# Patient Record
Sex: Male | Born: 1960 | Race: Black or African American | Hispanic: No | Marital: Married | Smoking: Never smoker
Health system: Southern US, Community
[De-identification: ages and names within clinical notes are randomized; demographics above are authoritative.]

## PROBLEM LIST (undated history)

## (undated) DIAGNOSIS — M1990 Unspecified osteoarthritis, unspecified site: Secondary | ICD-10-CM

## (undated) DIAGNOSIS — R7303 Prediabetes: Secondary | ICD-10-CM

## (undated) DIAGNOSIS — T4145XA Adverse effect of unspecified anesthetic, initial encounter: Secondary | ICD-10-CM

## (undated) DIAGNOSIS — I10 Essential (primary) hypertension: Secondary | ICD-10-CM

## (undated) DIAGNOSIS — G709 Myoneural disorder, unspecified: Secondary | ICD-10-CM

## (undated) DIAGNOSIS — T8859XA Other complications of anesthesia, initial encounter: Secondary | ICD-10-CM

## (undated) HISTORY — PX: TOTAL KNEE ARTHROPLASTY: SHX125

## (undated) HISTORY — PX: FOOT FRACTURE SURGERY: SHX645

## (undated) HISTORY — PX: ROTATOR CUFF REPAIR: SHX139

---

## 1999-02-01 ENCOUNTER — Encounter: Payer: Self-pay | Admitting: Emergency Medicine

## 1999-02-01 ENCOUNTER — Emergency Department (HOSPITAL_COMMUNITY): Admission: EM | Admit: 1999-02-01 | Discharge: 1999-02-01 | Payer: Self-pay | Admitting: Emergency Medicine

## 1999-03-13 ENCOUNTER — Emergency Department (HOSPITAL_COMMUNITY): Admission: EM | Admit: 1999-03-13 | Discharge: 1999-03-13 | Payer: Self-pay | Admitting: Emergency Medicine

## 1999-03-13 ENCOUNTER — Encounter: Payer: Self-pay | Admitting: Emergency Medicine

## 1999-06-03 ENCOUNTER — Emergency Department (HOSPITAL_COMMUNITY): Admission: EM | Admit: 1999-06-03 | Discharge: 1999-06-03 | Payer: Self-pay | Admitting: Internal Medicine

## 1999-08-06 ENCOUNTER — Emergency Department (HOSPITAL_COMMUNITY): Admission: EM | Admit: 1999-08-06 | Discharge: 1999-08-06 | Payer: Self-pay | Admitting: Emergency Medicine

## 1999-08-06 ENCOUNTER — Encounter: Payer: Self-pay | Admitting: Emergency Medicine

## 1999-10-16 ENCOUNTER — Emergency Department (HOSPITAL_COMMUNITY): Admission: EM | Admit: 1999-10-16 | Discharge: 1999-10-16 | Payer: Self-pay | Admitting: Emergency Medicine

## 1999-10-16 ENCOUNTER — Encounter: Payer: Self-pay | Admitting: Emergency Medicine

## 2000-05-18 ENCOUNTER — Encounter: Payer: Self-pay | Admitting: Orthopedic Surgery

## 2000-05-18 ENCOUNTER — Ambulatory Visit (HOSPITAL_COMMUNITY): Admission: RE | Admit: 2000-05-18 | Discharge: 2000-05-18 | Payer: Self-pay | Admitting: Orthopedic Surgery

## 2000-09-06 ENCOUNTER — Emergency Department (HOSPITAL_COMMUNITY): Admission: EM | Admit: 2000-09-06 | Discharge: 2000-09-06 | Payer: Self-pay

## 2000-09-07 ENCOUNTER — Emergency Department (HOSPITAL_COMMUNITY): Admission: EM | Admit: 2000-09-07 | Discharge: 2000-09-07 | Payer: Self-pay | Admitting: *Deleted

## 2001-09-19 ENCOUNTER — Emergency Department (HOSPITAL_COMMUNITY): Admission: EM | Admit: 2001-09-19 | Discharge: 2001-09-19 | Payer: Self-pay | Admitting: Emergency Medicine

## 2001-11-08 ENCOUNTER — Ambulatory Visit (HOSPITAL_BASED_OUTPATIENT_CLINIC_OR_DEPARTMENT_OTHER): Admission: RE | Admit: 2001-11-08 | Discharge: 2001-11-08 | Payer: Self-pay | Admitting: Orthopedic Surgery

## 2002-12-29 ENCOUNTER — Encounter: Payer: Self-pay | Admitting: Emergency Medicine

## 2002-12-29 ENCOUNTER — Inpatient Hospital Stay (HOSPITAL_COMMUNITY): Admission: AD | Admit: 2002-12-29 | Discharge: 2002-12-30 | Payer: Self-pay | Admitting: Emergency Medicine

## 2002-12-30 ENCOUNTER — Encounter: Payer: Self-pay | Admitting: Internal Medicine

## 2003-01-04 ENCOUNTER — Encounter: Admission: RE | Admit: 2003-01-04 | Discharge: 2003-01-04 | Payer: Self-pay | Admitting: Internal Medicine

## 2003-01-25 ENCOUNTER — Encounter: Admission: RE | Admit: 2003-01-25 | Discharge: 2003-01-25 | Payer: Self-pay | Admitting: Family Medicine

## 2003-02-02 ENCOUNTER — Encounter: Admission: RE | Admit: 2003-02-02 | Discharge: 2003-02-02 | Payer: Self-pay | Admitting: Family Medicine

## 2003-02-22 ENCOUNTER — Encounter: Admission: RE | Admit: 2003-02-22 | Discharge: 2003-02-22 | Payer: Self-pay | Admitting: Family Medicine

## 2003-03-14 ENCOUNTER — Encounter: Admission: RE | Admit: 2003-03-14 | Discharge: 2003-03-14 | Payer: Self-pay | Admitting: Family Medicine

## 2003-03-22 ENCOUNTER — Encounter: Admission: RE | Admit: 2003-03-22 | Discharge: 2003-03-22 | Payer: Self-pay | Admitting: Sports Medicine

## 2003-07-23 ENCOUNTER — Emergency Department (HOSPITAL_COMMUNITY): Admission: EM | Admit: 2003-07-23 | Discharge: 2003-07-23 | Payer: Self-pay | Admitting: Emergency Medicine

## 2003-10-29 ENCOUNTER — Emergency Department (HOSPITAL_COMMUNITY): Admission: EM | Admit: 2003-10-29 | Discharge: 2003-10-29 | Payer: Self-pay | Admitting: Emergency Medicine

## 2003-11-22 ENCOUNTER — Inpatient Hospital Stay (HOSPITAL_COMMUNITY): Admission: RE | Admit: 2003-11-22 | Discharge: 2003-11-26 | Payer: Self-pay | Admitting: Orthopedic Surgery

## 2004-09-13 ENCOUNTER — Emergency Department (HOSPITAL_COMMUNITY): Admission: EM | Admit: 2004-09-13 | Discharge: 2004-09-13 | Payer: Self-pay | Admitting: Emergency Medicine

## 2004-10-26 ENCOUNTER — Emergency Department (HOSPITAL_COMMUNITY): Admission: EM | Admit: 2004-10-26 | Discharge: 2004-10-26 | Payer: Self-pay | Admitting: Emergency Medicine

## 2004-12-02 ENCOUNTER — Emergency Department (HOSPITAL_COMMUNITY): Admission: EM | Admit: 2004-12-02 | Discharge: 2004-12-02 | Payer: Self-pay | Admitting: Emergency Medicine

## 2006-02-06 ENCOUNTER — Inpatient Hospital Stay (HOSPITAL_COMMUNITY): Admission: RE | Admit: 2006-02-06 | Discharge: 2006-02-10 | Payer: Self-pay | Admitting: Orthopedic Surgery

## 2006-05-11 ENCOUNTER — Ambulatory Visit: Payer: Self-pay | Admitting: Pain Medicine

## 2006-05-28 DIAGNOSIS — R519 Headache, unspecified: Secondary | ICD-10-CM | POA: Insufficient documentation

## 2006-05-28 DIAGNOSIS — R51 Headache: Secondary | ICD-10-CM

## 2006-05-28 DIAGNOSIS — G43909 Migraine, unspecified, not intractable, without status migrainosus: Secondary | ICD-10-CM | POA: Insufficient documentation

## 2006-06-15 ENCOUNTER — Ambulatory Visit: Payer: Self-pay | Admitting: Pain Medicine

## 2006-07-01 ENCOUNTER — Ambulatory Visit: Payer: Self-pay | Admitting: Pain Medicine

## 2006-07-29 ENCOUNTER — Ambulatory Visit: Payer: Self-pay | Admitting: Pain Medicine

## 2006-08-27 ENCOUNTER — Ambulatory Visit: Payer: Self-pay | Admitting: Pain Medicine

## 2006-09-14 ENCOUNTER — Ambulatory Visit: Payer: Self-pay | Admitting: Physician Assistant

## 2006-09-21 ENCOUNTER — Ambulatory Visit: Payer: Self-pay | Admitting: Pain Medicine

## 2006-10-15 ENCOUNTER — Ambulatory Visit: Payer: Self-pay | Admitting: Physician Assistant

## 2006-11-06 ENCOUNTER — Ambulatory Visit: Payer: Self-pay | Admitting: Physician Assistant

## 2006-12-07 ENCOUNTER — Ambulatory Visit: Payer: Self-pay | Admitting: Physician Assistant

## 2007-01-05 ENCOUNTER — Ambulatory Visit: Payer: Self-pay | Admitting: Physician Assistant

## 2007-02-15 ENCOUNTER — Ambulatory Visit: Payer: Self-pay | Admitting: Physician Assistant

## 2007-03-17 ENCOUNTER — Ambulatory Visit: Payer: Self-pay | Admitting: Physician Assistant

## 2010-04-12 ENCOUNTER — Emergency Department (HOSPITAL_COMMUNITY)
Admission: EM | Admit: 2010-04-12 | Discharge: 2010-04-12 | Payer: Self-pay | Source: Home / Self Care | Admitting: Emergency Medicine

## 2010-04-15 ENCOUNTER — Emergency Department (HOSPITAL_COMMUNITY)
Admission: EM | Admit: 2010-04-15 | Discharge: 2010-04-15 | Payer: Self-pay | Source: Home / Self Care | Admitting: Family Medicine

## 2010-05-10 ENCOUNTER — Inpatient Hospital Stay (INDEPENDENT_AMBULATORY_CARE_PROVIDER_SITE_OTHER)
Admission: RE | Admit: 2010-05-10 | Discharge: 2010-05-10 | Disposition: A | Discharge status: Home / Self Care | Payer: Commercial Managed Care - PPO | Source: Ambulatory Visit | Attending: Family Medicine | Admitting: Family Medicine

## 2010-05-10 DIAGNOSIS — R059 Cough, unspecified: Secondary | ICD-10-CM

## 2010-05-10 DIAGNOSIS — J019 Acute sinusitis, unspecified: Secondary | ICD-10-CM

## 2010-05-10 DIAGNOSIS — R05 Cough: Secondary | ICD-10-CM

## 2010-05-10 DIAGNOSIS — R0982 Postnasal drip: Secondary | ICD-10-CM

## 2010-08-16 NOTE — Discharge Summary (Signed)
NAMEAUSENCIO, VADEN               ACCOUNT NO.:  [id number]   MEDICAL RECORD NO.:  [id number]          PATIENT TYPE:  INP   LOCATION:  5022                         FACILITY:  [facility]   PHYSICIAN:  Harvie Junior, M.D.   DATE OF BIRTH:  [birth year]   DATE OF ADMISSION:  02/06/2006  DATE OF DISCHARGE:  02/10/2006                               DISCHARGE SUMMARY   ADMITTING DIAGNOSES:  1. Painful right knee, status post right total knee replacement in      August of 2005.  2. 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  3. Chronic pain.   DISCHARGE DIAGNOSES:  1. Painful right knee, status post right total knee replacement.  2. Painful extensive synovitis in the scar tissue formation right      knee, status post total knee replacement.  3. Chronic pain.   PROCEDURES IN HOSPITAL:  Right total knee revision with extensive  synovectomy and scar tissue excision with revision of tibial  polyethelene component.   BRIEF HISTORY:  Melvin Sweeney is a 50 year old male who has a history of  work injury to his right knee.  He had several arthroscopies and  ultimately underwent a right total knee replacement in August of 2005.  He did well initially and developed persistent right knee pain with  weight bearing.  He had x-rays of the knee, which showed no gross  loosening of his prosthesis.  He got no relief with medications, rest,  modifications of activity.  Aspirate of the right knee showed no sign of  infection.  He was admitted for right knee revision and exploration as  he continued to have significant pain requiring narcotics, despite  extensive treatment over a period of greater this one year for this  knee.   PERTINENT LABORATORY STUDIES:  Patient's hemoglobin, on admission, was  15.9, hematocrit 46.7.  On postop day number 1, his hemoglobin was 13.5,  number 2 it was 13.4.  INR, on admission, was 1.0 with a Pro Time of  13.6 seconds.  On the date of his discharge, his INR was  2.2.  His CMET,  on admission, was unremarkable.  BMET, on postop day number 1, was  unremarkable.  Urinalysis showed no abnormalities.  Cultures, from the  right knee fluid, showed no growth 3 days.  Gram stain showed no  organisms seen.  He had no anaerobes isolated.   HOSPITAL COURSE:  The patient underwent right total knee revision, as  well as described in Dr. Luiz Blare operative note on February 06, 2006.  He  was put on a PCA morphine pump for pain control postoperatively and IV  antibiotics were instituted.  A Foley catheter had been placed at time  of surgery.  On postop day number 1, his Foley was discontinued.  He was  doing relatively well.  His INR was 1.0 and his hemoglobin was 13.5.  It  is of note, he was given PCA Dilaudid, not PCA morphine.  On postop day  number 2, he had complaints of pain, but was covered with the PCA pump.  He was voiding without difficulties.  He was taking fluids without  difficulty.  He had a low grade fever of 100 degrees and vital signs  were stable.  His range of motion of the knee, at this point, was 10  degrees to 60 degrees.  His calf was soft.  __________intact distally.  His dressing was changed and his drain was pulled.  He was continued on  the PCA morphine pump for an additional day.  On postop day number 3,  morphine pump was finally discontinued and patient made good progress  with physical therapy.  On February 10, 2006 patient was complaining of  moderate knee pain on the right, he was taking fluids and voiding  difficulty.  He is progressing with PT, vital signs were stable and he  was afebrile.  The right knee dressing was clean and dry.  His calf was  soft.  His INR was 2.2.  He was discharged home in improved condition on  a regular diet and was given a prescription for fentanyl patches 50 mcg  1 q.48 hours, OxyContin 20 mg b.i.d., Percocet 5 mg 1-2 q.6 hours p.r.n.  breakthrough pain and Coumadin x1 month postoperative DVT  prophylaxis.  He will ambulate weight bearing as tolerated on the right with a walker,  to work and aggressive range of motion with home health physical therapy  and Coumadin management will be needed for an INR shooting for a goal of  2.0.  He will follow up with Dr. Luiz Blare in the office in 10 days.  At  the time of discharge, his wound was benign.      Marshia Ly, P.A.      Harvie Junior, M.D.  Electronically Signed    JB/MEDQ  D:  03/27/2006  T:  03/28/2006  Job:  [id number]

## 2010-08-16 NOTE — Discharge Summary (Signed)
NAME:  Melvin Sweeney, Melvin Sweeney                         ACCOUNT NO.:  [id number]   MEDICAL RECORD NO.:  [id number]                   PATIENT TYPE:  INP   LOCATION:  3037                                 FACILITY:  [facility]   PHYSICIAN:  Dineen Kid. Reche Dixon, M.D.               DATE OF BIRTH:  1960/07/20   DATE OF ADMISSION:  12/29/2002  DATE OF DISCHARGE:  12/30/2002                                 DISCHARGE SUMMARY   DISCHARGE DIAGNOSES:  1. Fever and headache, etiology undetermined.  Consider [facility]     spotted fever.  2. Hepatitis.   DISCHARGE MEDICATIONS:  Doxycycline 100 mg b.i.d. per RM for 21 days.   FOLLOWUP:  Follow up on January 04, 2003, 2 p.m., at the outpatient clinic.  For a STAT CMP prior to clinic appointment.   HISTORY:   GENERAL DATA:  This is a 50 year old African American male with no  significant past medical history, who presented to the emergency department  with a history of two weeks of headache.  Three months ago, he was bitten by  an insect.  Two weeks later, he developed headache but did not seek medical  attention at that time.  The headache worsened over the past two weeks,  increasing in intensity.  The patient was seen by Dr. Marlan Palau and  given some unrecalled medication.  On day of admission, headache became very  severe.  He developed nausea, fever and chills one day before consult at the  ED.   ALLERGIES:  CHLORPROMAZINE.   PAST MEDICAL HISTORY:  Knee surgery in 2003.   FAMILY HISTORY:  Hypertension in mother.   PERSONAL AND SOCIAL HISTORY:  No smoking or alcohol intake.  He is married  and works at the [employer]   REVIEW OF SYSTEMS:  Review of systems positive for fever, chills, nausea,  rash.   PHYSICAL EXAMINATION ON ADMISSION:  VITAL SIGNS:  Blood pressure 134/78,  pulse rate of 125, respiratory rate of 18, temperature of 103.1, O2  saturation of 98% on room air.  GENERAL:  No abnormality detected.  HEENT:  PERRLA,  EOM are intact; no lesions noted on ear examination; no  tonsillar or pharyngeal congestion, no exudate nor erythema.  NECK:  Neck is supple; no thyromegaly.  LUNGS:  Respirations clear to auscultation bilaterally, no rales, no  wheezes.  CVS:  Regular rate and rhythm.  No murmurs.  ABDOMEN:  Normal bowel sounds; soft, no masses, no tenderness.  EXTREMITIES:  No edema.  SKIN:  Skin warm, no rashes, no lesions.  NEUROLOGIC:  Intact cranial nerves II-XII, motor 5/5 in all extremities,  sensory intact.  No Kernig and Brudzinski's signs.  PSYCHIATRIC:  Alert, oriented x3.   LABORATORIES:  Hemoglobin 15.6, hematocrit 46, WBC 8.9, platelets 190,000.  Sodium 135, potassium 3.9, chloride 103, CO2 25, BUN 10, creatinine 0.1,  glucose 111, bilirubin 0.6, alkaline phosphatase  145, SGOT 289, SGPT 428,  protein 7.5, albumin 3.8, calcium 9.5.  Urinalysis was normal.  A lumbar tap  was done which showed the following results:  Opening pressure of 25 mm,  glucose 71 mcg, total protein 50 mcg, wbc's present, mononuclear -- no  organism seen, rbc 1, wbc's 4.   A liver ultrasound done showed normal results.   ADMITTING DIAGNOSES:  1. Fever and headache, etiology undetermined, to consider [facility]     spotted fever, Lyme disease.  2. Hepatitis.   COURSE IN THE HOSPITAL:  The patient underwent a lumbar tap with results  stated above.  The patient became afebrile after 24 hours' observation.  The  headache also resolved.  Patient was discharged with instructions to follow  up at the outpatient clinic.  He was also instructed to complete a three-  week course of doxycycline 100 mg b.i.d. per RM.   OTHER PERTINENT LABORATORIES:  HIV nonreactive.  Urine drug screen positive  for opiates.  Blood cultures negative.  Cryptococcus antigen negative.  Lyme  disease IgG and IgM pending.  [facility] spotted fever IgG pending.  Herpes simplex virus pending.   Chest x-ray:  No active disease.  CT of  the head without contrast medium:  Negative non-contrast head CT.   Serum RPR pending.      Lawerance Sabal, MD                         Dineen Kid. Reche Dixon, M.D.    MC/MEDQ  D:  01/04/2003  T:  01/05/2003  Job:  [id number]   cc:   [facility]   Dineen Kid. Reche Dixon, M.D.  [address]  [place] [address]  Fax: [phone]

## 2010-08-16 NOTE — Op Note (Signed)
Melvin Sweeney, Melvin Sweeney               ACCOUNT NO.:  [id number]   MEDICAL RECORD NO.:  [id number]          PATIENT TYPE:  INP   LOCATION:  5022                         FACILITY:  [facility]   PHYSICIAN:  Harvie Junior, M.D.   DATE OF BIRTH:  1961-03-22   DATE OF PROCEDURE:  02/24/2006  DATE OF DISCHARGE:  02/10/2006                               OPERATIVE REPORT   PREOPERATIVE DIAGNOSIS:  Failed right total knee replacement.   POSTOPERATIVE DIAGNOSIS:  Failed right total knee replacement.   PRINCIPLE PROCEDURE:  Revision right total knee replacement.   SURGEON:  Harvie Junior, M.D.   ASSISTANT:  Marshia Ly, P.A.   ANESTHESIA:  General.   BRIEF HISTORY:  Mr. Senn is a gentleman, who has a long history of  having had a total knee replacement.  He had continued and chronic pain.  Over time, we had gotten him into pain management and had been treating  him for a long time with that.  He had done reasonably well until  recently, when he began having increasing complaints of pain.  We talked  about treatment options, including continued pain management.  We had  tried injection therapy, anti-inflammatory medication.  Because of  continuing complaints of pain and failure of all conservative care, we  ultimately felt that a thorough and radical synovectomy with replacement  of components as needed would be appropriate, and he was taken to the  operating room for this procedure.   PROCEDURE:  The patient was taken to the operating room, and after  adequate anesthesia had been obtained with a general anesthetic, the  patient was placed supine on the operating room table.  The left knee  was then examined under anesthesia and noted to be stable in all  directions.  He was then prepped and draped in the usual sterile  fashion, and after exsanguination, a blood pressure tourniquet was  inflated to 300 mmHg.  Following this, a straight incision was made and  the subcutaneous tissues  dissected down to the level of the extensor  mechanism.  A medial parapatellar arthrotomy was undertaken and the scar  tissue was begun to be removed.  Massive synovectomy was performed below  the quad tendon all the way down to the joint line medially and  laterally.  The polyethylene was then removed and the remaining  posterior aspect of scar tissue was debrided extensively and thoroughly.  Once this was accomplished, the components were then evaluated.  The  tibial component needed to be revised by way of changing out the  polyethylene.  The patellar component appeared to be in reasonable  position.  The femoral component was tight, without evidence of  loosening.  The tibial component at this point was tapped on and did not  appear to be loose.  Cultures were sent intraoperatively and the frozen  section came back during the case, which showed no organisms.  At this  point, after massive debridement of scar tissue and thorough evaluation  of components and revision of the tibial component by way of  polyethylene, the knee was copiously  irrigated and suctioned dry.  A new  tibial component was put in and the knee put through a range of motion.  Excellent stability was achieved in an easy full range of motion.  The  medial parapatellar arthrotomy was closed at this point, as well as  closure of the knee wound, and a sterile compressive dressing was  applied, as well as a knee immobilizer, and the patient was taken to the  recovery room, where he was noted to be in satisfactory condition.  The  estimated blood loss for the procedure was none.      Harvie Junior, M.D.  Electronically Signed     JLG/MEDQ  D:  02/24/2006  T:  02/24/2006  Job:  [id number]

## 2010-08-16 NOTE — Op Note (Signed)
NAME:  Melvin Sweeney, Melvin Sweeney                         ACCOUNT NO.:  [id number]   MEDICAL RECORD NO.:  [id number]                   PATIENT TYPE:  MS   LOCATION:                                       FACILITY:  [facility]   PHYSICIAN:  Harvie Junior, M.D.                DATE OF BIRTH:  1960-05-06   DATE OF PROCEDURE:  11/08/2001  DATE OF DISCHARGE:  09/19/2001                                 OPERATIVE REPORT   PREOPERATIVE DIAGNOSES:  Medial meniscal tear.   POSTOPERATIVE DIAGNOSES:  1. Medial meniscal tear.  2. Chondromalacia of the trochlea.   OPERATION PERFORMED:  1. Partial medial meniscectomy.  2. Debridement of femoral trochlea.   SURGEON:  Harvie Junior, M.D.   ASSISTANT:  Currie Paris. Thedore Mins.   ANESTHESIA:  General.   INDICATIONS FOR PROCEDURE:  The patient is a 50 year old male with a long  history of having had a twisting injury at work, he ultimately suffered a  medial meniscal tear.  He was seen by Dr. Scherrie Bateman who evaluated him with  MRI showing medial meniscal tear and sent him over for evaluation.  Examination at that time showed that he had a medial joint line tear, showed  positive McMurray examination.  Talked about treatment options and  ultimately felt to need arthroscopic intervention and he was taken to the  operating room for same.   DESCRIPTION OF PROCEDURE:  The patient was taken to the operating room where  adequate anesthesia was obtained with general anesthetic.  The patient was  placed supine on the operating table.  The right leg was then prepped and  draped in the usual sterile fashion.  Following this, routine arthroscopic  examination of the knee revealed that there was obvious tear of the  posterior horn of the medial meniscus.  This was debrided back to a smooth  and stable rim with a straight-biting forceps, upbiting forceps.  The medial  meniscal rim was contoured down with suction shaver.  Attention was then  turned to the medial femoral  condyle and medial tibial plateau which showed  that there was some grade 3 and 4 changes on that medial side.  On the  tibial plateau, some grade 4 changes, posteromedially and some grade 3  changes on the medial femoral condyle, mostly straight medially.  Following  this, attention was turned to the lateral side where there was no  significant abnormality.  The anterior cruciate was noted to be normal.  Attention was turned up to the patellofemoral compartment where there was  noted to be some grade 2 and 3 change in the patellofemoral trochlea.  The  trochlea was debrided back to a smooth and stable rim.  Once this  was accomplished, the patient had his knee copiously irrigated and suctioned  dry.  The arthroscopic portals were closed with a bandage.  Sterile  compressive dressing was  applied and the patient taken to recovery where she  was noted to be in satisfactory condition.  The estimated blood loss for  this procedure was  none.                                                 Harvie Junior, M.D.    Ranae Plumber  D:  11/08/2001  T:  11/10/2001  Job:  [id number]

## 2010-08-16 NOTE — Op Note (Signed)
NAME:  Melvin Sweeney, Melvin Sweeney                         ACCOUNT NO.:  [id number]   MEDICAL RECORD NO.:  [id number]                   PATIENT TYPE:  INP   LOCATION:  5032                                 FACILITY:  [facility]   PHYSICIAN:  Harvie Junior, M.D.                DATE OF BIRTH:  21-Dec-1960   DATE OF PROCEDURE:  11/22/2003  DATE OF DISCHARGE:                                 OPERATIVE REPORT   PREOPERATIVE DIAGNOSIS:  Degenerative joint disease right knee.   POSTOPERATIVE DIAGNOSIS:  Degenerative joint disease right knee.   PRINCIPAL PROCEDURE:  Right total knee replacement.   SURGEON:  Harvie Junior, M.D.   ASSISTANT:  Marshia Ly, P.A.   ANESTHESIA:  General anesthesia.   BRIEF HISTORY:  He is a 50 year old male with a long history of having had  significant right knee pain.  He ultimately had undergone knee arthroscopy  x2 with continued complaints of pain.  He had grade IV changes on the medial  tibial plateau, grade III changes on the medial femoral condyle and grade  III changes in the femoral trochlea.  We had followed him for a prolonged  period of time, tried multiple conservative care including arthroscopy,  including anti-inflammatory medication, including injection therapy.  All of  this worked some but never really relieved his pain adequately and because  of continued complaints of pain, he was ultimately taken to the operating  room for total knee replacement.   DESCRIPTION OF PROCEDURE:  The patient taken to the operating room.  After  adequate anesthesia obtained with general anesthetic, the patient was placed  supine on the operating table.  The right leg was prepped and draped in the  usual sterile fashion.  Following this, midline incision was made in  subcutaneous tissue and taken down to the level of the extensor mechanism  which was identified and then a medial parapatellar arthrotomy was  undertaken.  At this point, the tibia was cut perpendicular to the  long  axis.  There were 9 mm of distal femur then resected off the distal portion  of the femur.  The femur was then sized to a level of 4 and the size 4  femoral component was chosen and anterior and posterior cuts were made as  well as Chamfers.  The box was then cut.  Tibia was then sized to a 4 and  the tibia was sized at this point.  Following this, the keels were hammered  at this point also.  At this point, the patella was then cut to a level of  13 mm and the lugs were drilled for 38 mm patella.  Trials were then all put  in place and the knee was put through a range of motion.  Excellent  stability was achieved and the trial components were then removed.  The knee  was then copiously irrigated with pulsatile lavage irrigation and  suctioned  dry.  The components were then cemented into place.  Final components 10 mm  bridge and __________ put in place, 38 mm patella, size 4 tibia, size 4  femur, excellent range of motion was achieved, the cement was allowed to  harden and then excellent range of motion was achieved.  Medial parapatellar  arthrotomy was closed over a medium Hemovac  drain.  Subcutaneous with 0 and 2-0 Vicryl and skin with skin staples.  Sterile compressive dressing applied and the patient was taken to the  recovery room and noted to be in satisfactory condition.   ESTIMATED BLOOD LOSS:  None.                                               Harvie Junior, M.D.    Ranae Plumber  D:  11/22/2003  T:  11/22/2003  Job:  [id number]

## 2010-08-16 NOTE — Discharge Summary (Signed)
   NAME:  Melvin Sweeney, Melvin Sweeney                         ACCOUNT NO.:  [id number]   MEDICAL RECORD NO.:  [id number]                   PATIENT TYPE:  INP   LOCATION:  3037                                 FACILITY:  [facility]   PHYSICIAN:  Lawerance Sabal, MD                   DATE OF BIRTH:  21-May-1960   DATE OF ADMISSION:  12/29/2002  DATE OF DISCHARGE:  12/30/2002                                 DISCHARGE SUMMARY   DISCHARGE DIAGNOSES:  1. Fever and headache, etiology undetermined, to consider [facility]     Spotted Fever.  2. Hepatitis.   DISCHARGE MEDICATIONS:  Doxycycline 100 mg b.i.d. for three weeks.   DICTATION ENDED HERE                                                  Lawerance Sabal, MD    MC/MEDQ  D:  01/04/2003  T:  01/05/2003  Job:  [id number]

## 2010-08-16 NOTE — Discharge Summary (Signed)
NAMEHATIM, HOMANN               ACCOUNT NO.:  [id number]   MEDICAL RECORD NO.:  [id number]          PATIENT TYPE:  INP   LOCATION:  5032                         FACILITY:  [facility]   PHYSICIAN:  Harvie Junior, M.D.   DATE OF BIRTH:  [birth year]   DATE OF ADMISSION:  11/22/2003  DATE OF DISCHARGE:  11/26/2003                                 DISCHARGE SUMMARY   ADMISSION DIAGNOSIS:  Degenerative joint disease right knee not responsive  to exhaustive conservative management.   DISCHARGE DIAGNOSIS:  Degenerative joint disease right knee not responsive  to exhaustive conservative management.   PROCEDURE PERFORMED:  Right total knee arthroplasty November 24, 2003.   BRIEF HISTORY:  Melvin Sweeney is a 50 year old male who has a history of an  injury to his right knee at work.  He has had a long history of right knee  pain.  He has had two knee arthroscopies, one in 2003 and the second in  2004, which showed grade 4 changes of the medial femoral condyle.  He also  had some patellofemoral arthritic changes noted, as well.  Despite  exhaustive conservative management including multiple injections, use of  viscous supplementation, modification of his activity and so forth, he  continued to have pain in his knee, pain at night, and pain with weight-  bearing.  X-rays showed medial compartment narrowing, nearly bone on bone.  He only got temporary relief with injections.  Because of his night pain and  pain with ambulation, and the fact that he had not improved with exhaustive  conservative management, he was admitted for a right total knee  arthroplasty.   PERTINENT LABORATORY DATA:  Hemoglobin on admission was 16.1, hematocrit  46.9.  On postop day one, his hemoglobin was 13.8, postoperative day two  11.3.  His protime was 12.6 seconds with an INR of 0.9, PTT 30, on  admission.  On the day of discharge, his protime was 17.1 seconds with an  INR of 1.6.  CMP on admission was unremarkable.  BMP  on postop day one was  unremarkable.   HOSPITAL COURSE:  The patient underwent right total knee arthroplasty as  described in Dr. Luiz Blare' operative note on November 22, 2003.  Postoperatively, he was put on 1 gram Ancef IV q.8h. x 4 doses.  He was put  on the PCA morphine pump for pain control and he was immediately started on  a CPM machine for range of motion of the knee.  He was given IV Toradol for  pain control and was also started on Coumadin antithrombolytic therapy per  pharmacy protocol for DVT prophylaxis.  On postop day one, he complained of  significant pain in his right leg.  He had an INR of 1.1.  His hemoglobin  was 13.8.  His vital signs were stable, he was afebrile.  His right leg  showed normal neurovascular status distally with a soft calf.  He had a  slightly decreased sodium and this was watched.  He was started with  physical therapy for walker ambulation.  He is able to ambulate 100 feet  with a sliding walker.  On postop day two, he had significant right knee  pain.  He was taking fluids and voiding without difficulty.  His hemoglobin  was 11.3, INR 1.1, his right knee was dry and his dressing was changed.  His  IV was converted to a saline lock.  Aggressive Coumadin therapy was  recommended to increase his INR.  He was started on Lovenox subcu until the  INR was greater than 2.  His dressing was changed and the Hemovac drain was  pulled.  He continued to make progress but continued to have difficulty  controlling his pain.  On postop day four, November 26, 2003, he was  discharged in improved condition.  He had been afebrile and his vital signs  were stable.  His knee wound was benign.  He was discharged home in improved  condition.  He was instructed to ambulate weight bearing as tolerated on the  right with a walker.  His medications at the time of discharge will be Tylox  1-2 q.4-6h. p.r.n. pain, Coumadin x one month managed by Home Health.  His  home medications  will be continued.  He will be on a regular diet.  Home CPM  is ordered as well as Home Health physical therapy and Home Health RN for  Coumadin management.  He will follow up with Dr. Luiz Blare in ten days.  The dressing was changed  prior to the discharge and he will change the dressing at home on his own as  needed.  We did encourage the patient to work very hard on range of motion  of the knee so he did not end up with a stiff knee and he promised that he  would work on this.       JB/MEDQ  D:  01/10/2004  T:  01/10/2004  Job:  [id number]

## 2010-09-30 ENCOUNTER — Other Ambulatory Visit (HOSPITAL_COMMUNITY): Payer: Self-pay | Admitting: Orthopaedic Surgery

## 2010-09-30 DIAGNOSIS — M25579 Pain in unspecified ankle and joints of unspecified foot: Secondary | ICD-10-CM

## 2010-10-01 ENCOUNTER — Other Ambulatory Visit (HOSPITAL_COMMUNITY): Payer: Self-pay | Admitting: Orthopaedic Surgery

## 2010-10-09 ENCOUNTER — Encounter (HOSPITAL_COMMUNITY): Payer: Commercial Managed Care - PPO

## 2010-10-09 ENCOUNTER — Other Ambulatory Visit (HOSPITAL_COMMUNITY): Payer: Commercial Managed Care - PPO

## 2010-10-14 ENCOUNTER — Other Ambulatory Visit (HOSPITAL_COMMUNITY): Payer: Commercial Managed Care - PPO

## 2010-10-14 ENCOUNTER — Encounter (HOSPITAL_COMMUNITY): Payer: Commercial Managed Care - PPO

## 2010-10-14 ENCOUNTER — Encounter (HOSPITAL_COMMUNITY)
Admission: RE | Admit: 2010-10-14 | Discharge: 2010-10-14 | Disposition: A | Discharge status: Home / Self Care | Payer: Worker's Compensation | Source: Ambulatory Visit | Attending: Orthopaedic Surgery | Admitting: Orthopaedic Surgery

## 2010-10-14 DIAGNOSIS — M25579 Pain in unspecified ankle and joints of unspecified foot: Secondary | ICD-10-CM | POA: Insufficient documentation

## 2010-10-14 DIAGNOSIS — S99919A Unspecified injury of unspecified ankle, initial encounter: Secondary | ICD-10-CM | POA: Insufficient documentation

## 2010-10-14 DIAGNOSIS — S8990XA Unspecified injury of unspecified lower leg, initial encounter: Secondary | ICD-10-CM | POA: Insufficient documentation

## 2010-10-14 DIAGNOSIS — X58XXXA Exposure to other specified factors, initial encounter: Secondary | ICD-10-CM | POA: Insufficient documentation

## 2010-10-14 MED ORDER — TECHNETIUM TC 99M MEDRONATE IV KIT
25.0000 | PACK | Freq: Once | INTRAVENOUS | Status: AC | PRN
  Administered 2010-10-14: 26.6 via INTRAVENOUS

## 2011-07-14 ENCOUNTER — Emergency Department (INDEPENDENT_AMBULATORY_CARE_PROVIDER_SITE_OTHER)
Admission: EM | Admit: 2011-07-14 | Discharge: 2011-07-14 | Disposition: A | Discharge status: Home / Self Care | Payer: 59 | Source: Home / Self Care | Attending: Family Medicine | Admitting: Family Medicine

## 2011-07-14 ENCOUNTER — Encounter (HOSPITAL_COMMUNITY): Payer: Self-pay | Admitting: Emergency Medicine

## 2011-07-14 DIAGNOSIS — M25569 Pain in unspecified knee: Secondary | ICD-10-CM

## 2011-07-14 MED ORDER — TRAMADOL HCL 50 MG PO TABS: 50.0000 mg | ORAL_TABLET | Freq: Four times a day (QID) | ORAL | Status: AC | PRN

## 2011-07-14 NOTE — ED Provider Notes (Signed)
History     CSN: [id number]  Arrival date & time 07/14/11  9528   First MD Initiated Contact with Patient 07/14/11 [phone]      Chief Complaint  Patient presents with  . Knee Injury    (Consider location/radiation/quality/duration/timing/severity/associated sxs/prior treatment) HPI Comments: Melvin Sweeney presents for evaluation of right knee pain after falling with his horse yesterday. He reports that he was riding his horse while rounding a cows, when a deer jumped out of the brush, thus startling his horse. He reports that both he and the course fell landing on his right knee. He reports a previous history of 2 knee surgeries in that knee; a total knee replacement in 2005, and a revision in 2007. He reports he has been icing the knee over the weekend and using his crutches. He continues to ambulate without difficulty. He reports improvement in his pain and denies any numbness, tingling, or weakness. He reports that the orthopedic provider I did his surgery is currently out of the country and will not be back until next week. He reports that he only requests pain control today. Patient is a 51 y.o. male presenting with knee pain.  Knee Pain This is a new problem. The current episode started more than 2 days ago. The problem has been gradually improving. The symptoms are aggravated by walking and standing. The symptoms are relieved by ice, acetaminophen and NSAIDs. He has tried acetaminophen and a cold compress for the symptoms. The treatment provided mild relief.    History reviewed. No pertinent past medical history.  Past Surgical History  Procedure Date  . Total knee arthroplasty RIGHT    2007    No family history on file.  History  Substance Use Topics  . Smoking status: Never Smoker   . Smokeless tobacco: Not on file  . Alcohol Use: No      Review of Systems  Constitutional: Negative.   HENT: Negative.   Eyes: Negative.   Respiratory: Negative.   Cardiovascular: Negative.     Gastrointestinal: Negative.   Genitourinary: Negative.   Musculoskeletal: Positive for joint swelling and gait problem.       RIGHT knee pain  Skin: Negative.   Neurological: Negative.     Allergies  Ibuprofen  Home Medications   Current Outpatient Rx  Name Route Sig Dispense Refill  . TRAMADOL HCL 50 MG PO TABS Oral Take 1 tablet (50 mg total) by mouth every 6 (six) hours as needed for pain. Maximum dose= 8 tablets per day 45 tablet 0    BP 156/98  Pulse 98  Temp(Src) 98.6 F (37 C) (Oral)  Resp 16  SpO2 99%  Physical Exam  Nursing note and vitals reviewed. Constitutional: He is oriented to person, place, and time. He appears well-developed and well-nourished.  HENT:  Head: Normocephalic and atraumatic.  Eyes: EOM are normal.  Neck: Normal range of motion.  Pulmonary/Chest: Effort normal.  Musculoskeletal: Normal range of motion.       Right knee: He exhibits swelling and LCL laxity. He exhibits normal range of motion and no effusion. tenderness found. Medial joint line and lateral joint line tenderness noted.       RIGHT Knee: LARGE anterior vertical scar from previous surgery; negative patellar grind, negative patellar facet tenderness, positive medial joint line tenderness; no lateral joint line tenderness, negative patellar tendon tenderness, negative Lachman's, negative McMurray's; mild laxity but no pain with varus stress but none with valgus stress   Neurological: He is alert  and oriented to person, place, and time.  Skin: Skin is warm and dry.  Psychiatric: His behavior is normal.    ED Course  Procedures (including critical care time)  Labs Reviewed - No data to display No results found.   1. Knee pain       MDM  Rx given for tramadol; referred back to Orthopaedic provider for follow up        Renaee Munda, MD 07/14/11 1029

## 2011-07-14 NOTE — ED Notes (Signed)
HERE WITH RIGHT KNEE PAIN S/P INJURY AT HOME YESTERDAY.PT STATES HIS LEG GOT CAUGHT UNDER HIS HORSE AND THEY BOTH FELL OVER.PT STATES ITS THE KNEE FROM TKR 2007.SWELLING REPORTED BUT PT ICED AND USED CRUTCHES OVER THE WEEKEND.CALLED SURGEON BUT UNABLE TO SEE PT DUE TO OUT OF TOWN

## 2011-07-14 NOTE — Discharge Instructions (Signed)
Take the pain medication as directed. You may also use acetaminophen or ibuprofen or Aleve for baseline pain control. Please followup with your orthopedic provider, who originally did your surgery, next week as discussed. Return to care sooner than your followup appointment should your symptoms worsen in any way, such as numbness, tingling, or weakness in your leg, or you are experiencing falls.

## 2016-11-12 ENCOUNTER — Other Ambulatory Visit: Payer: Self-pay | Admitting: Orthopedic Surgery

## 2016-11-13 ENCOUNTER — Encounter (HOSPITAL_BASED_OUTPATIENT_CLINIC_OR_DEPARTMENT_OTHER): Payer: Self-pay | Admitting: *Deleted

## 2016-11-19 ENCOUNTER — Encounter (HOSPITAL_BASED_OUTPATIENT_CLINIC_OR_DEPARTMENT_OTHER): Payer: Self-pay | Admitting: *Deleted

## 2016-11-19 ENCOUNTER — Ambulatory Visit (HOSPITAL_BASED_OUTPATIENT_CLINIC_OR_DEPARTMENT_OTHER)
Admission: RE | Admit: 2016-11-19 | Discharge: 2016-11-19 | Disposition: A | Discharge status: Home / Self Care | Payer: Medicaid Other | Source: Ambulatory Visit | Attending: Orthopedic Surgery | Admitting: Orthopedic Surgery

## 2016-11-19 ENCOUNTER — Ambulatory Visit (HOSPITAL_COMMUNITY): Payer: Medicaid Other

## 2016-11-19 ENCOUNTER — Ambulatory Visit (HOSPITAL_BASED_OUTPATIENT_CLINIC_OR_DEPARTMENT_OTHER): Payer: Medicaid Other | Admitting: Anesthesiology

## 2016-11-19 ENCOUNTER — Encounter (HOSPITAL_BASED_OUTPATIENT_CLINIC_OR_DEPARTMENT_OTHER)
Admission: RE | Disposition: A | Discharge status: Home / Self Care | Payer: Self-pay | Source: Ambulatory Visit | Attending: Orthopedic Surgery

## 2016-11-19 DIAGNOSIS — I10 Essential (primary) hypertension: Secondary | ICD-10-CM | POA: Insufficient documentation

## 2016-11-19 DIAGNOSIS — Z79899 Other long term (current) drug therapy: Secondary | ICD-10-CM | POA: Insufficient documentation

## 2016-11-19 DIAGNOSIS — R0602 Shortness of breath: Secondary | ICD-10-CM

## 2016-11-19 DIAGNOSIS — M19012 Primary osteoarthritis, left shoulder: Secondary | ICD-10-CM | POA: Diagnosis not present

## 2016-11-19 DIAGNOSIS — Z9889 Other specified postprocedural states: Secondary | ICD-10-CM | POA: Diagnosis not present

## 2016-11-19 HISTORY — DX: Essential (primary) hypertension: I10

## 2016-11-19 HISTORY — DX: Other complications of anesthesia, initial encounter: T88.59XA

## 2016-11-19 HISTORY — DX: Adverse effect of unspecified anesthetic, initial encounter: T41.45XA

## 2016-11-19 HISTORY — PX: RESECTION DISTAL CLAVICAL: SHX5053

## 2016-11-19 HISTORY — DX: Myoneural disorder, unspecified: G70.9

## 2016-11-19 SURGERY — EXCISION, CLAVICLE, DISTAL, OPEN
Anesthesia: Regional | Site: Shoulder | Laterality: Left

## 2016-11-19 MED ORDER — FENTANYL CITRATE (PF) 100 MCG/2ML IJ SOLN
INTRAMUSCULAR | Status: AC
  Filled 2016-11-19: qty 2

## 2016-11-19 MED ORDER — OXYCODONE-ACETAMINOPHEN 5-325 MG PO TABS: 1.0000 | ORAL_TABLET | ORAL | 0 refills | Status: DC | PRN

## 2016-11-19 MED ORDER — MIDAZOLAM HCL 2 MG/2ML IJ SOLN
1.0000 mg | INTRAMUSCULAR | Status: DC | PRN
  Administered 2016-11-19 (×2): 2 mg via INTRAVENOUS

## 2016-11-19 MED ORDER — BUPIVACAINE-EPINEPHRINE (PF) 0.5% -1:200000 IJ SOLN
INTRAMUSCULAR | Status: DC | PRN
  Administered 2016-11-19: 25 mL via PERINEURAL

## 2016-11-19 MED ORDER — CEFAZOLIN SODIUM-DEXTROSE 2-4 GM/100ML-% IV SOLN
2.0000 g | INTRAVENOUS | Status: AC
  Administered 2016-11-19: 2 g via INTRAVENOUS

## 2016-11-19 MED ORDER — LIDOCAINE 2% (20 MG/ML) 5 ML SYRINGE
INTRAMUSCULAR | Status: DC | PRN
  Administered 2016-11-19: 60 mg via INTRAVENOUS

## 2016-11-19 MED ORDER — OXYCODONE HCL 5 MG PO TABS
ORAL_TABLET | ORAL | Status: AC
  Filled 2016-11-19: qty 1

## 2016-11-19 MED ORDER — PROMETHAZINE HCL 25 MG/ML IJ SOLN: 6.2500 mg | INTRAMUSCULAR | Status: DC | PRN

## 2016-11-19 MED ORDER — SUCCINYLCHOLINE CHLORIDE 20 MG/ML IJ SOLN
INTRAMUSCULAR | Status: DC | PRN
  Administered 2016-11-19: 50 mg via INTRAVENOUS
  Administered 2016-11-19: 100 mg via INTRAVENOUS
  Administered 2016-11-19: 50 mg via INTRAVENOUS

## 2016-11-19 MED ORDER — ONDANSETRON HCL 4 MG/2ML IJ SOLN
INTRAMUSCULAR | Status: DC | PRN
  Administered 2016-11-19: 4 mg via INTRAVENOUS

## 2016-11-19 MED ORDER — FAMOTIDINE IN NACL 20-0.9 MG/50ML-% IV SOLN
20.0000 mg | Freq: Once | INTRAVENOUS | Status: AC
  Administered 2016-11-19: 20 mg via INTRAVENOUS

## 2016-11-19 MED ORDER — MIDAZOLAM HCL 2 MG/2ML IJ SOLN
INTRAMUSCULAR | Status: AC
  Filled 2016-11-19: qty 2

## 2016-11-19 MED ORDER — FAMOTIDINE IN NACL 20-0.9 MG/50ML-% IV SOLN
INTRAVENOUS | Status: AC
  Filled 2016-11-19: qty 50

## 2016-11-19 MED ORDER — CHLORHEXIDINE GLUCONATE 4 % EX LIQD: 60.0000 mL | Freq: Once | CUTANEOUS | Status: DC

## 2016-11-19 MED ORDER — BUPIVACAINE-EPINEPHRINE 0.5% -1:200000 IJ SOLN
INTRAMUSCULAR | Status: DC | PRN
  Administered 2016-11-19: 12.5 mL

## 2016-11-19 MED ORDER — OXYCODONE HCL 5 MG/5ML PO SOLN: 5.0000 mg | Freq: Once | ORAL | Status: AC | PRN

## 2016-11-19 MED ORDER — HYDROMORPHONE HCL 1 MG/ML IJ SOLN: 0.2500 mg | INTRAMUSCULAR | Status: DC | PRN

## 2016-11-19 MED ORDER — LACTATED RINGERS IV SOLN
INTRAVENOUS | Status: DC
  Administered 2016-11-19 (×2): via INTRAVENOUS

## 2016-11-19 MED ORDER — KETOROLAC TROMETHAMINE 30 MG/ML IJ SOLN
INTRAMUSCULAR | Status: AC
  Filled 2016-11-19: qty 1

## 2016-11-19 MED ORDER — SCOPOLAMINE 1 MG/3DAYS TD PT72: 1.0000 | MEDICATED_PATCH | Freq: Once | TRANSDERMAL | Status: DC | PRN

## 2016-11-19 MED ORDER — CEFAZOLIN SODIUM-DEXTROSE 2-4 GM/100ML-% IV SOLN
INTRAVENOUS | Status: AC
  Filled 2016-11-19: qty 100

## 2016-11-19 MED ORDER — FENTANYL CITRATE (PF) 100 MCG/2ML IJ SOLN
50.0000 ug | INTRAMUSCULAR | Status: DC | PRN
  Administered 2016-11-19 (×2): 50 ug via INTRAVENOUS

## 2016-11-19 MED ORDER — DEXAMETHASONE SODIUM PHOSPHATE 4 MG/ML IJ SOLN
INTRAMUSCULAR | Status: DC | PRN
  Administered 2016-11-19: 10 mg via INTRAVENOUS

## 2016-11-19 MED ORDER — PROPOFOL 10 MG/ML IV BOLUS
INTRAVENOUS | Status: DC | PRN
  Administered 2016-11-19: 200 mg via INTRAVENOUS

## 2016-11-19 MED ORDER — KETOROLAC TROMETHAMINE 30 MG/ML IJ SOLN: 30.0000 mg | Freq: Once | INTRAMUSCULAR | Status: DC | PRN

## 2016-11-19 MED ORDER — OXYCODONE HCL 5 MG PO TABS
5.0000 mg | ORAL_TABLET | Freq: Once | ORAL | Status: AC | PRN
  Administered 2016-11-19: 5 mg via ORAL

## 2016-11-19 MED ORDER — MEPERIDINE HCL 25 MG/ML IJ SOLN: 6.2500 mg | INTRAMUSCULAR | Status: DC | PRN

## 2016-11-19 SURGICAL SUPPLY — 78 items
AID PSTN UNV HD RSTRNT DISP (MISCELLANEOUS) ×2
APL SKNCLS STERI-STRIP NONHPOA (GAUZE/BANDAGES/DRESSINGS) ×2
BENZOIN TINCTURE PRP APPL 2/3 (GAUZE/BANDAGES/DRESSINGS) ×3 IMPLANT
BLADE 4.2CUDA (BLADE) ×4 IMPLANT
BLADE SURG 15 STRL LF DISP TIS (BLADE) IMPLANT
BLADE SURG 15 STRL SS (BLADE)
BLADE VORTEX 6.0 (BLADE) ×4 IMPLANT
CANNULA 5.75X71 LONG (CANNULA) IMPLANT
CANNULA TWIST IN 8.25X7CM (CANNULA) IMPLANT
CLOSURE WOUND 1/2 X4 (GAUZE/BANDAGES/DRESSINGS) ×1
DECANTER SPIKE VIAL GLASS SM (MISCELLANEOUS) IMPLANT
DRAPE INCISE IOBAN 66X45 STRL (DRAPES) ×4 IMPLANT
DRAPE STERI 35X30 U-POUCH (DRAPES) ×1 IMPLANT
DRAPE SURG 17X23 STRL (DRAPES) ×4 IMPLANT
DRAPE U-SHAPE 47X51 STRL (DRAPES) ×4 IMPLANT
DRAPE U-SHAPE 76X120 STRL (DRAPES) ×8 IMPLANT
DRSG EMULSION OIL 3X3 NADH (GAUZE/BANDAGES/DRESSINGS) ×4 IMPLANT
DRSG PAD ABDOMINAL 8X10 ST (GAUZE/BANDAGES/DRESSINGS) ×8 IMPLANT
DURAPREP 26ML APPLICATOR (WOUND CARE) ×4 IMPLANT
ELECT REM PT RETURN 9FT ADLT (ELECTROSURGICAL) ×4
ELECTRODE REM PT RTRN 9FT ADLT (ELECTROSURGICAL) ×2 IMPLANT
GAUZE SPONGE 4X4 12PLY STRL (GAUZE/BANDAGES/DRESSINGS) ×4 IMPLANT
GLOVE BIOGEL PI IND STRL 8 (GLOVE) ×5 IMPLANT
GLOVE BIOGEL PI INDICATOR 8 (GLOVE) ×6
GLOVE ECLIPSE 7.5 STRL STRAW (GLOVE) ×8 IMPLANT
GLOVE SURG SS PI 7.0 STRL IVOR (GLOVE) ×3 IMPLANT
GOWN STRL REUS W/ TWL LRG LVL3 (GOWN DISPOSABLE) ×2 IMPLANT
GOWN STRL REUS W/ TWL XL LVL3 (GOWN DISPOSABLE) ×2 IMPLANT
GOWN STRL REUS W/TWL LRG LVL3 (GOWN DISPOSABLE) ×4
GOWN STRL REUS W/TWL XL LVL3 (GOWN DISPOSABLE) ×8 IMPLANT
MANIFOLD NEPTUNE II (INSTRUMENTS) ×4 IMPLANT
NDL 1/2 CIR CATGUT .05X1.09 (NEEDLE) IMPLANT
NDL HYPO 18GX1.5 BLUNT FILL (NEEDLE) ×1 IMPLANT
NDL SCORPION MULTI FIRE (NEEDLE) IMPLANT
NDL SUT 6 .5 CRC .975X.05 MAYO (NEEDLE) IMPLANT
NEEDLE 1/2 CIR CATGUT .05X1.09 (NEEDLE) IMPLANT
NEEDLE HYPO 18GX1.5 BLUNT FILL (NEEDLE) ×4 IMPLANT
NEEDLE MAYO TAPER (NEEDLE)
NEEDLE SCORPION MULTI FIRE (NEEDLE) IMPLANT
NS IRRIG 1000ML POUR BTL (IV SOLUTION) IMPLANT
PACK ARTHROSCOPY DSU (CUSTOM PROCEDURE TRAY) ×4 IMPLANT
PACK BASIN DAY SURGERY FS (CUSTOM PROCEDURE TRAY) ×4 IMPLANT
PASSER SUT SWANSON 36MM LOOP (INSTRUMENTS) IMPLANT
PENCIL BUTTON HOLSTER BLD 10FT (ELECTRODE) IMPLANT
PROBE BIPOLAR ATHRO 135MM 90D (MISCELLANEOUS) ×4 IMPLANT
RESTRAINT HEAD UNIVERSAL NS (MISCELLANEOUS) ×4 IMPLANT
SET IRRIG Y TYPE TUR BLADDER L (SET/KITS/TRAYS/PACK) ×4 IMPLANT
SLEEVE SCD COMPRESS KNEE MED (MISCELLANEOUS) ×4 IMPLANT
SLING ARM FOAM STRAP LRG (SOFTGOODS) ×3 IMPLANT
SLING ARM IMMOBILIZER LRG (SOFTGOODS) IMPLANT
SLING ARM MED ADULT FOAM STRAP (SOFTGOODS) IMPLANT
SLING ARM XL FOAM STRAP (SOFTGOODS) IMPLANT
SPONGE LAP 4X18 X RAY DECT (DISPOSABLE) IMPLANT
STRIP CLOSURE SKIN 1/2X4 (GAUZE/BANDAGES/DRESSINGS) ×2 IMPLANT
SUCTION FRAZIER HANDLE 10FR (MISCELLANEOUS) ×2
SUCTION TUBE FRAZIER 10FR DISP (MISCELLANEOUS) ×1 IMPLANT
SUT ETHIBOND 2 OS 4 DA (SUTURE) IMPLANT
SUT ETHILON 4 0 PS 2 18 (SUTURE) IMPLANT
SUT FIBERWIRE #2 38 T-5 BLUE (SUTURE)
SUT MNCRL AB 3-0 PS2 18 (SUTURE) IMPLANT
SUT PDS AB 0 CT 36 (SUTURE) IMPLANT
SUT TICRON 1 T 12 (SUTURE) IMPLANT
SUT TIGER TAPE 7 IN WHITE (SUTURE) IMPLANT
SUT VIC AB 0 CT1 27 (SUTURE)
SUT VIC AB 0 CT1 27XBRD ANBCTR (SUTURE) IMPLANT
SUT VIC AB 1 CT1 27 (SUTURE) ×4
SUT VIC AB 1 CT1 27XBRD ANBCTR (SUTURE) ×1 IMPLANT
SUT VIC AB 2-0 SH 27 (SUTURE)
SUT VIC AB 2-0 SH 27XBRD (SUTURE) IMPLANT
SUTURE FIBERWR #2 38 T-5 BLUE (SUTURE) IMPLANT
SYR 5ML LL (SYRINGE) ×4 IMPLANT
TAPE FIBER 2MM 7IN #2 BLUE (SUTURE) IMPLANT
TOWEL OR 17X24 6PK STRL BLUE (TOWEL DISPOSABLE) ×4 IMPLANT
TOWEL OR NON WOVEN STRL DISP B (DISPOSABLE) ×4 IMPLANT
TUBE CONNECTING 20'X1/4 (TUBING) ×1
TUBE CONNECTING 20X1/4 (TUBING) ×2 IMPLANT
WATER STERILE IRR 1000ML POUR (IV SOLUTION) ×4 IMPLANT
YANKAUER SUCT BULB TIP NO VENT (SUCTIONS) ×3 IMPLANT

## 2016-11-19 NOTE — Discharge Instructions (Signed)
Wear sling for comfort. Leave dressing in place until follow up appt.    Call your surgeon if you experience:   1.  Fever over 101.0. 2.  Inability to urinate. 3.  Nausea and/or vomiting. 4.  Extreme swelling or bruising at the surgical site. 5.  Continued bleeding from the incision. 6.  Increased pain, redness or drainage from the incision. 7.  Problems related to your pain medication. 8.  Any problems and/or concerns    Post Anesthesia Home Care Instructions  Activity: Get plenty of rest for the remainder of the day. A responsible individual must stay with you for 24 hours following the procedure.  For the next 24 hours, DO NOT: -Drive a car -Advertising copywriter -Drink alcoholic beverages -Take any medication unless instructed by your physician -Make any legal decisions or sign important papers.  Meals: Start with liquid foods such as gelatin or soup. Progress to regular foods as tolerated. Avoid greasy, spicy, heavy foods. If nausea and/or vomiting occur, drink only clear liquids until the nausea and/or vomiting subsides. Call your physician if vomiting continues.  Special Instructions/Symptoms: Your throat may feel dry or sore from the anesthesia or the breathing tube placed in your throat during surgery. If this causes discomfort, gargle with warm salt water. The discomfort should disappear within 24 hours.  If you had a scopolamine patch placed behind your ear for the management of post- operative nausea and/or vomiting:  1. The medication in the patch is effective for 72 hours, after which it should be removed.  Wrap patch in a tissue and discard in the trash. Wash hands thoroughly with soap and water. 2. You may remove the patch earlier than 72 hours if you experience unpleasant side effects which may include dry mouth, dizziness or visual disturbances. 3. Avoid touching the patch. Wash your hands with soap and water after contact with the patch.     Regional  Anesthesia Blocks  1. Numbness or the inability to move the "blocked" extremity may last from 3-48 hours after placement. The length of time depends on the medication injected and your individual response to the medication. If the numbness is not going away after 48 hours, call your surgeon.  2. The extremity that is blocked will need to be protected until the numbness is gone and the  Strength has returned. Because you cannot feel it, you will need to take extra care to avoid injury. Because it may be weak, you may have difficulty moving it or using it. You may not know what position it is in without looking at it while the block is in effect.  3. For blocks in the legs and feet, returning to weight bearing and walking needs to be done carefully. You will need to wait until the numbness is entirely gone and the strength has returned. You should be able to move your leg and foot normally before you try and bear weight or walk. You will need someone to be with you when you first try to ensure you do not fall and possibly risk injury.  4. Bruising and tenderness at the needle site are common side effects and will resolve in a few days.  5. Persistent numbness or new problems with movement should be communicated to the surgeon or the [facility] [phone] [facility] [phone]).

## 2016-11-19 NOTE — Brief Op Note (Signed)
11/19/2016  4:12 PM  PATIENT:  Melvin Sweeney  56 y.o. male  PRE-OPERATIVE DIAGNOSIS:   Acromioclavicular joint arthritis left shoulder  POST-OPERATIVE DIAGNOSIS:  Acromioclavicular joint arthritis lef  PROCEDURE:  Procedure(s): OPEN DISTAL CLAVICLE RESECTION (Left)  SURGEON:  Surgeon(s) and Role:    Jodi Geralds, MD - Primary  PHYSICIAN ASSISTANT:   ASSISTANTS: bethune    ANESTHESIA:   general  EBL:  Total I/O In: 1222 [P.O.:222; I.V.:1000] Out: -   BLOOD ADMINISTERED:none  DRAINS: none   LOCAL MEDICATIONS USED:  MARCAINE     SPECIMEN:  No Specimen  DISPOSITION OF SPECIMEN:  N/A  COUNTS:  YES  TOURNIQUET:  * No tourniquets in log *  DICTATION: .Other Dictation: Dictation Number [id number]  PLAN OF CARE: Admit to inpatient   PATIENT DISPOSITION:  PACU - hemodynamically stable.   Delay start of Pharmacological VTE agent (>24hrs) due to surgical blood loss or risk of bleeding: no

## 2016-11-19 NOTE — Transfer of Care (Signed)
Immediate Anesthesia Transfer of Care Note  Patient: Melvin Sweeney  Procedure(s) Performed: Procedure(s): OPEN DISTAL CLAVICLE RESECTION (Left)  Patient Location: PACU  Anesthesia Type:General and GA combined with regional for post-op pain  Level of Consciousness: awake  Airway & Oxygen Therapy: Patient Spontanous Breathing and Patient connected to face mask oxygen  Post-op Assessment: Report given to RN and Post -op Vital signs reviewed and stable  Post vital signs: Reviewed and stable  Last Vitals:  Vitals:   11/19/16 1215 11/19/16 1220  BP: 116/70   Pulse: 79 84  Resp: 16 18  Temp:    SpO2: 100% 100%    Last Pain:  Vitals:   11/19/16 1102  TempSrc: Oral  PainSc: 8          Complications: No apparent anesthesia complications

## 2016-11-19 NOTE — H&P (Signed)
PREOPERATIVE H&P  Chief Complaint: Left shoulder pain  HPI: Melvin Sweeney is a 56 y.o. male who presents for evaluation of left shoulder pain. It has been present for many months and has been worsening. The patient underwent shoulder arthroscopy with arthroscopic decompression several years ago. He has had some overgrowth of the distal clavicle and he has had pain where the overgrowth has occurred. He has failed conservative measures. Pain is rated as moderate.  Past Medical History:  Diagnosis Date  . Complication of anesthesia    'scratched throat and spitting blood after RCR surgery'  . Hypertension   . Neuromuscular disorder [facility])    Past Surgical History:  Procedure Laterality Date  . FOOT FRACTURE SURGERY    . ROTATOR CUFF REPAIR    . TOTAL KNEE ARTHROPLASTY  RIGHT   2007   Social History   Social History  . Marital status: Married    Spouse name: N/A  . Number of children: N/A  . Years of education: N/A   Social History Main Topics  . Smoking status: Never Smoker  . Smokeless tobacco: Never Used  . Alcohol use No  . Drug use: No  . Sexual activity: Not Asked   Other Topics Concern  . None   Social History Narrative  . None   History reviewed. No pertinent family history. Allergies  Allergen Reactions  . Ibuprofen     STOMACH ACHE EVEN IF TAKEN WITH FOOD   Prior to Admission medications   Medication Sig Start Date End Date Taking? Authorizing Provider  amLODipine (NORVASC) 10 MG tablet Take 10 mg by mouth daily.   Yes [provider]  atorvastatin (LIPITOR) 20 MG tablet Take 20 mg by mouth daily.   Yes [provider]  cetirizine (ZYRTEC) 10 MG tablet Take 10 mg by mouth daily.   Yes [provider]  Multiple Vitamins-Minerals (MULTIVITAMIN WITH MINERALS) tablet Take 1 tablet by mouth daily.   Yes [provider]  omeprazole (PRILOSEC) 40 MG capsule Take 40 mg by mouth daily.   Yes [provider]      Positive ROS: None  All other systems have been reviewed and were otherwise negative with the exception of those mentioned in the HPI and as above.  Physical Exam: Vitals:   11/19/16 1215 11/19/16 1220  BP: 116/70   Pulse: 79 84  Resp: 16 18  Temp:    SpO2: 100% 100%    General: Alert, no acute distress Cardiovascular: No pedal edema Respiratory: No cyanosis, no use of accessory musculature GI: No organomegaly, abdomen is soft and non-tender Skin: No lesions in the area of chief complaint Neurologic: Sensation intact distally Psychiatric: Patient is competent for consent with normal mood and affect Lymphatic: No axillary or cervical lymphadenopathy  MUSCULOSKELETAL: Left shoulder: Painful range of motion. Tender to palpation over the distal clavicle area.  Assessment/Plan: Acromioclavicular joint arthritis left shoulder Plan for Procedure(s): OPEN DISTAL CLAVICLE RESECTION  The risks benefits and alternatives were discussed with the patient including but not limited to the risks of nonoperative treatment, versus surgical intervention including infection, bleeding, nerve injury, malunion, nonunion, hardware prominence, hardware failure, need for hardware removal, blood clots, cardiopulmonary complications, morbidity, mortality, among others, and they were willing to proceed.  Predicted outcome is good, although there will be at least a six to nine month expected recovery.  Harvie Junior, MD 11/19/2016 12:32 PM

## 2016-11-19 NOTE — Progress Notes (Signed)
Assisted Dr. Germeroth with left, ultrasound guided, supraclavicular block. Side rails up, monitors on throughout procedure. See vital signs in flow sheet. Tolerated Procedure well. 

## 2016-11-19 NOTE — Anesthesia Procedure Notes (Signed)
Anesthesia Regional Block: Interscalene brachial plexus block   Pre-Anesthetic Checklist: ,, timeout performed, Correct Patient, Correct Site, Correct Laterality, Correct Procedure, Correct Position, site marked, Risks and benefits discussed,  Surgical consent,  Pre-op evaluation,  At surgeon's request and post-op pain management  Laterality: Left  Prep: chloraprep       Needles:  Injection technique: Single-shot  Needle Type: Stimulator Needle - 40     Needle Length: 4cm  Needle Gauge: 22     Additional Needles:   Procedures: ultrasound guided,,,,,,,,  Narrative:  Start time: 11/19/2016 12:08 PM End time: 11/19/2016 12:11 PM Injection made incrementally with aspirations every 5 mL. Anesthesiologist: Lewie Loron  Additional Notes: BP cuff, EKG monitors applied. Sedation begun. Nerve location verified with U/S. Anesthetic injected incrementally, slowly , and after neg aspirations under direct u/s guidance. Good perineural spread. Tolerated well.

## 2016-11-19 NOTE — Anesthesia Procedure Notes (Signed)
Procedure Name: Intubation Date/Time: 11/19/2016 1:15 PM Performed by: Caren Macadam Pre-anesthesia Checklist: Patient identified, Emergency Drugs available, Suction available and Patient being monitored Patient Re-evaluated:Patient Re-evaluated prior to induction Oxygen Delivery Method: Circle system utilized Preoxygenation: Pre-oxygenation with 100% oxygen Induction Type: IV induction Ventilation: Mask ventilation without difficulty Laryngoscope Size: Glidescope (elective due to extreme sore throat last surgery) Grade View: Grade I Tube type: Oral Number of attempts: 1 Airway Equipment and Method: Stylet and Video-laryngoscopy Placement Confirmation: ETT inserted through vocal cords under direct vision,  positive ETCO2 and breath sounds checked- equal and bilateral Secured at: 23 cm Tube secured with: Tape Dental Injury: Teeth and Oropharynx as per pre-operative assessment  Comments: Elective glide scope due to extreme sore throat and spitting up blood for several days, per patient. Grade one view, passed ett with ease through cords, VSS

## 2016-11-19 NOTE — Anesthesia Preprocedure Evaluation (Addendum)
Anesthesia Evaluation  Patient identified by MRN, date of birth, ID band Patient awake    Reviewed: Allergy & Precautions, NPO status , Patient's Chart, lab work & pertinent test results  History of Anesthesia Complications (+) history of anesthetic complications  Airway Mallampati: II  TM Distance: >3 FB Neck ROM: Limited    Dental no notable dental hx.    Pulmonary neg pulmonary ROS,    Pulmonary exam normal breath sounds clear to auscultation       Cardiovascular hypertension, negative cardio ROS Normal cardiovascular exam Rhythm:Regular Rate:Normal     Neuro/Psych  Headaches,  Neuromuscular disease negative psych ROS   GI/Hepatic negative GI ROS, Neg liver ROS,   Endo/Other  negative endocrine ROS  Renal/GU negative Renal ROS     Musculoskeletal negative musculoskeletal ROS (+)   Abdominal   Peds  Hematology negative hematology ROS (+)   Anesthesia Other Findings   Reproductive/Obstetrics negative OB ROS                            Anesthesia Physical Anesthesia Plan  ASA: II  Anesthesia Plan: General and Regional   Post-op Pain Management: GA combined w/ Regional for post-op pain   Induction: Intravenous  PONV Risk Score and Plan: 2 and Ondansetron, Dexamethasone and Midazolam  Airway Management Planned: Oral ETT  Additional Equipment:   Intra-op Plan:   Post-operative Plan: Extubation in OR  Informed Consent: I have reviewed the patients History and Physical, chart, labs and discussed the procedure including the risks, benefits and alternatives for the proposed anesthesia with the patient or authorized representative who has indicated his/her understanding and acceptance.   Dental advisory given  Plan Discussed with: CRNA  Anesthesia Plan Comments:        Anesthesia Quick Evaluation

## 2016-11-20 ENCOUNTER — Encounter (HOSPITAL_BASED_OUTPATIENT_CLINIC_OR_DEPARTMENT_OTHER): Payer: Self-pay | Admitting: Orthopedic Surgery

## 2016-11-20 NOTE — Anesthesia Postprocedure Evaluation (Signed)
Anesthesia Post Note  Patient: VIDA EARLYWINE  Procedure(s) Performed: Procedure(s) (LRB): OPEN DISTAL CLAVICLE RESECTION (Left)     Patient location during evaluation: PACU Anesthesia Type: Regional and General Level of consciousness: sedated and patient cooperative Pain management: pain level controlled Vital Signs Assessment: post-procedure vital signs reviewed and stable Respiratory status: spontaneous breathing Cardiovascular status: stable Anesthetic complications: no    Last Vitals:  Vitals:   11/19/16 1552 11/19/16 1630  BP: (!) 145/89   Pulse: 89 92  Resp: 16 16  Temp: 37.1 C   SpO2: 93% 94%    Last Pain:  Vitals:   11/20/16 0856  TempSrc:   PainSc: 3                  Lewie Loron

## 2016-11-20 NOTE — Op Note (Signed)
NAME:  Melvin Sweeney, Melvin Sweeney                    ACCOUNT NO.:  MEDICAL RECORD NO.:  [id number]  LOCATION:                                 FACILITY:  PHYSICIAN:  Harvie Junior, M.D.        DATE OF BIRTH:  DATE OF PROCEDURE:  11/19/2016 DATE OF DISCHARGE:                              OPERATIVE REPORT   PREOPERATIVE DIAGNOSIS:  Painful left shoulder after previous arthroscopy with decompression and debridement and distal clavicle resection.  POSTOPERATIVE DIAGNOSIS:  Painful left shoulder after previous arthroscopy with decompression and debridement and distal clavicle resection.  PROCEDURE:  Open distal clavicle resection, left shoulder.  SURGEON:  Harvie Junior, M.D.  ASSIST:  Marshia Ly, P.A.  ANESTHESIA:  General.  BRIEF HISTORY:  Mr. Pafford is a 56 year old male with a long history and significant complaints of left shoulder pain.  He had been treated conservatively and ultimately underwent arthroscopic intervention with debridement, distal clavicle resection, and acromioplasty.  He did great for several years, but then began having some left shoulder pain.  X- rays unfortunately showed that he had some distal clavicular regrowth, and we injected it, he got better, but it continued to bother him and there was a bit of a prominence there and after failure of conservative care, he was taken to the operating room for open distal clavicle resection.  DESCRIPTION OF PROCEDURE:  The patient was taken to the operating room and after adequate anesthesia was obtained with general anesthetic, the patient was placed supine on the operating table.  The body was then placed into the beach chair position.  All bony prominences were well padded.  Attention was turned to the left shoulder.  After routine prep and drape and instillation of 15 mL of Marcaine with epinephrine, an incision was made over the distal clavicular area, subcutaneous tissues was taken down to the level of the  fascia.  The fascia was divided longitudinally and the distal clavicle was identified.  2 cm of distal clavicle were resected with a saw, and the remaining edges were rongeured down to make them smooth.  Once this was done, the wound was irrigated, suctioned dry.  1 Vicryl running to close the fascia, skin with 0 and 2- 0 Vicryl, and 3-0 Monocryl subcuticular.  Benzoin and Steri-Strips were applied.  Sterile compressive dressing was applied, and the patient was taken to the recovery room and was noted to be in satisfactory condition.  Estimated blood loss for procedure was minimal.     Harvie Junior, M.D.     Ranae Plumber  D:  11/19/2016  T:  11/19/2016  Job:  [id number]

## 2016-11-24 ENCOUNTER — Encounter (HOSPITAL_BASED_OUTPATIENT_CLINIC_OR_DEPARTMENT_OTHER): Payer: Self-pay | Admitting: Orthopedic Surgery

## 2019-03-26 IMAGING — CR DG CHEST 1V PORT
1 series · 1 of 1 positions shown · non-contrast
Comparison: PA and lateral chest 10/03/2016.

CLINICAL DATA: Decreased oxygen saturation. Status post left
shoulder surgery today.

EXAM:
PORTABLE CHEST 1 VIEW

[portable]
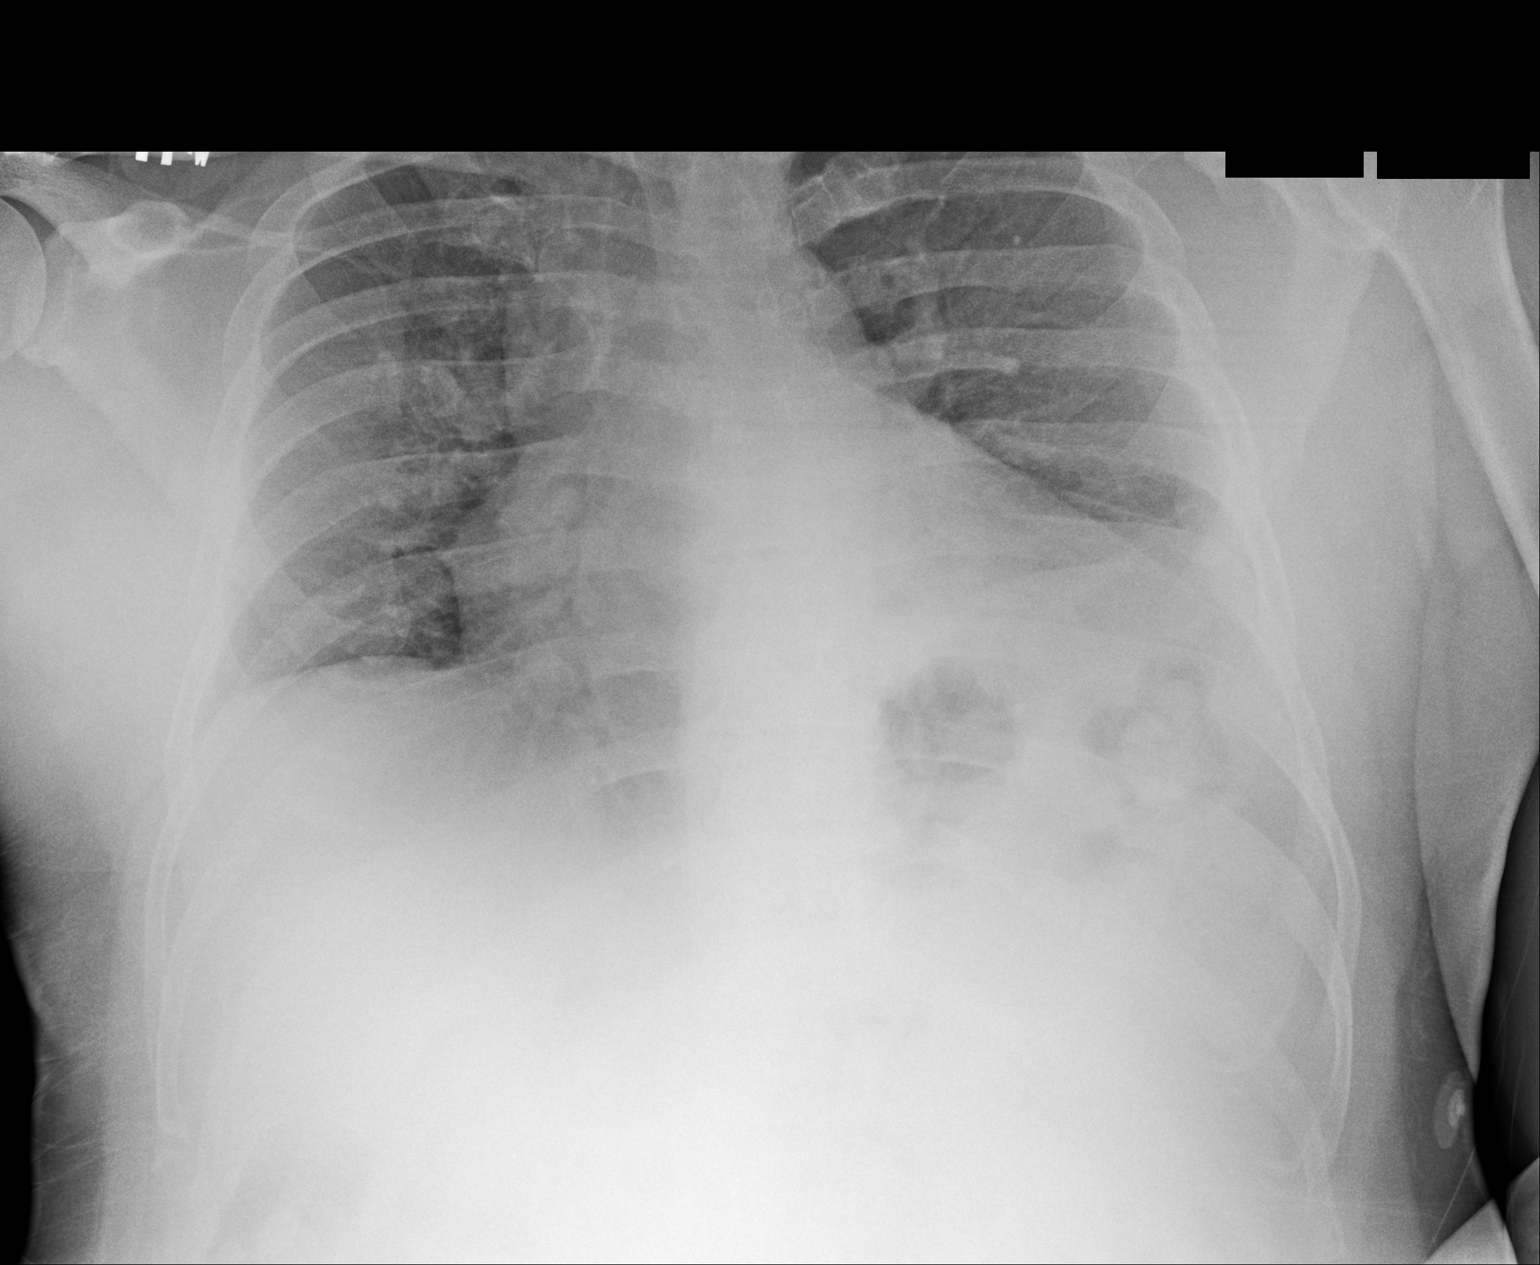

[1 of 1 positions shown; findings below may reference images not displayed]

FINDINGS: Lung volumes are low but the lungs are clear. Heart size is normal.
No pneumothorax or pleural effusion. Postoperative change distal
left clavicle noted.
IMPRESSION: No acute finding in a low volume chest.

## 2019-06-10 ENCOUNTER — Other Ambulatory Visit: Payer: Self-pay | Admitting: Orthopedic Surgery

## 2019-06-28 NOTE — Progress Notes (Signed)
PCP -[place] Clap Cardiologist -   Chest x-ray -  EKG -  Stress Test -  ECHO -  Cardiac Cath -   Sleep Study -  CPAP -   Fasting Blood Sugar -  Checks Blood Sugar _____ times a day  Blood Thinner Instructions: Aspirin Instructions: Last Dose:  Anesthesia review:   Patient denies shortness of breath, fever, cough and chest pain at PAT appointment   Patient verbalized understanding of instructions that were given to them at the PAT appointment. Patient was also instructed that they will need to review over the PAT instructions again at home before surgery.

## 2019-06-28 NOTE — Patient Instructions (Addendum)
DUE TO COVID-19 ONLY ONE VISITOR IS ALLOWED TO COME WITH YOU AND STAY IN THE WAITING ROOM ONLY DURING PRE OP AND PROCEDURE DAY OF SURGERY. THE 1 VISITOR MAY VISIT WITH YOU AFTER SURGERY IN YOUR PRIVATE ROOM DURING VISITING HOURS ONLY!  YOU NEED TO HAVE A COVID 19 TEST ON 07-05-19 @ 8:35 AM, THIS TEST MUST BE DONE BEFORE SURGERY, COME  [place], [place] , [id number].  ([place]) ONCE YOUR COVID TEST IS COMPLETED, PLEASE BEGIN THE QUARANTINE INSTRUCTIONS AS OUTLINED IN YOUR HANDOUT.                Melvin Sweeney  06/28/2019   Your procedure is scheduled on: 07-08-19   Report to [facility] Main  Entrance    Report to Short Stay at 5:30 AM     Call this number if you have problems the morning of surgery [phone]    Remember: NO SOLID FOOD AFTER MIDNIGHT THE NIGHT PRIOR TO SURGERY. NOTHING BY MOUTH EXCEPT CLEAR LIQUIDS UNTIL 4:15 AM . PLEASE FINISH ENSURE DRINK PER SURGEON ORDER  WHICH NEEDS TO BE COMPLETED AT 4:15 AM .   CLEAR LIQUID DIET   Foods Allowed                                                                     Foods Excluded  Coffee and tea, regular and decaf                             liquids that you cannot  Plain Jell-O any favor except red or purple                                           see through such as: Fruit ices (not with fruit pulp)                                     milk, soups, orange juice  Iced Popsicles                                    All solid food Carbonated beverages, regular and diet                                    Cranberry, grape and apple juices Sports drinks like Gatorade Lightly seasoned clear broth or consume(fat free) Sugar, honey syrup   _____________________________________________________________________       Take these medicines the morning of surgery with A SIP OF WATER: None  BRUSH YOUR TEETH MORNING OF SURGERY AND RINSE YOUR MOUTH OUT, NO CHEWING GUM CANDY OR MINTS.   DO NOT TAKE  ANY DIABETIC MEDICATIONS DAY OF YOUR SURGERY  You may not have any metal on your body including hair pins and              piercings     Do not wear jewelry, cologne, lotions, powders or  deodorant                        Men may shave face and neck.   Do not bring valuables to the hospital. [place] IS NOT             RESPONSIBLE   FOR VALUABLES.  Contacts, dentures or bridgework may not be worn into surgery.  .     Patients discharged the day of surgery will not be allowed to drive home. IF YOU ARE HAVING SURGERY AND GOING HOME THE SAME DAY, YOU MUST HAVE AN ADULT TO DRIVE YOU HOME AND BE WITH YOU FOR 24 HOURS. YOU MAY GO HOME BY TAXI OR UBER OR ORTHERWISE, BUT AN ADULT MUST ACCOMPANY YOU HOME AND STAY WITH YOU FOR 24 HOURS.  Name and phone number of your driver: Erinn Huskins [phone]  Special Instructions: N/A              Please read over the following fact sheets you were given: _____________________________________________________________________             [facility] - Preparing for Surgery Before surgery, you can play an important role.  Because skin is not sterile, your skin needs to be as free of germs as possible.  You can reduce the number of germs on your skin by washing with CHG (chlorahexidine gluconate) soap before surgery.  CHG is an antiseptic cleaner which kills germs and bonds with the skin to continue killing germs even after washing. Please DO NOT use if you have an allergy to CHG or antibacterial soaps.  If your skin becomes reddened/irritated stop using the CHG and inform your nurse when you arrive at Short Stay. Do not shave (including legs and underarms) for at least 48 hours prior to the first CHG shower.  You may shave your face/neck. Please follow these instructions carefully:  1.  Shower with CHG Soap the night before surgery and the  morning of Surgery.  2.  If you choose to wash your hair, wash your hair first as  usual with your  normal  shampoo.  3.  After you shampoo, rinse your hair and body thoroughly to remove the  shampoo.                           4.  Use CHG as you would any other liquid soap.  You can apply chg directly  to the skin and wash                       Gently with a scrungie or clean washcloth.  5.  Apply the CHG Soap to your body ONLY FROM THE NECK DOWN.   Do not use on face/ open                           Wound or open sores. Avoid contact with eyes, ears mouth and genitals (private parts).                       Wash face,  Genitals (private parts) with your normal soap.  6.  Wash thoroughly, paying special attention to the area where your surgery  will be performed.  7.  Thoroughly rinse your body with warm water from the neck down.  8.  DO NOT shower/wash with your normal soap after using and rinsing off  the CHG Soap.                9.  Pat yourself dry with a clean towel.            10.  Wear clean pajamas.            11.  Place clean sheets on your bed the night of your first shower and do not  sleep with pets. Day of Surgery : Do not apply any lotions/deodorants the morning of surgery.  Please wear clean clothes to the hospital/surgery center.  FAILURE TO FOLLOW THESE INSTRUCTIONS MAY RESULT IN THE CANCELLATION OF YOUR SURGERY PATIENT SIGNATURE_________________________________  NURSE SIGNATURE__________________________________  ________________________________________________________________________   Melvin Sweeney  An incentive spirometer is a tool that can help keep your lungs clear and active. This tool measures how well you are filling your lungs with each breath. Taking long deep breaths may help reverse or decrease the chance of developing breathing (pulmonary) problems (especially infection) following:  A long period of time when you are unable to move or be active. BEFORE THE PROCEDURE   If the spirometer includes an indicator to show your  best effort, your nurse or respiratory therapist will set it to a desired goal.  If possible, sit up straight or lean slightly forward. Try not to slouch.  Hold the incentive spirometer in an upright position. INSTRUCTIONS FOR USE  1. Sit on the edge of your bed if possible, or sit up as far as you can in bed or on a chair. 2. Hold the incentive spirometer in an upright position. 3. Breathe out normally. 4. Place the mouthpiece in your mouth and seal your lips tightly around it. 5. Breathe in slowly and as deeply as possible, raising the piston or the ball toward the top of the column. 6. Hold your breath for 3-5 seconds or for as long as possible. Allow the piston or ball to fall to the bottom of the column. 7. Remove the mouthpiece from your mouth and breathe out normally. 8. Rest for a few seconds and repeat Steps 1 through 7 at least 10 times every 1-2 hours when you are awake. Take your time and take a few normal breaths between deep breaths. 9. The spirometer may include an indicator to show your best effort. Use the indicator as a goal to work toward during each repetition. 10. After each set of 10 deep breaths, practice coughing to be sure your lungs are clear. If you have an incision (the cut made at the time of surgery), support your incision when coughing by placing a pillow or rolled up towels firmly against it. Once you are able to get out of bed, walk around indoors and cough well. You may stop using the incentive spirometer when instructed by your caregiver.  RISKS AND COMPLICATIONS  Take your time so you do not get dizzy or light-headed.  If you are in pain, you may need to take or ask for pain medication before doing incentive spirometry. It is harder to take a deep breath if you are having pain. AFTER USE  Rest and breathe slowly and easily.  It can be helpful to keep track of a log of your  progress. Your caregiver can provide you with a simple table to help with this. If  you are using the spirometer at home, follow these instructions: [place] IF:   You are having difficultly using the spirometer.  You have trouble using the spirometer as often as instructed.  Your pain medication is not giving enough relief while using the spirometer.  You develop fever of 100.5 F (38.1 C) or higher. SEEK IMMEDIATE MEDICAL CARE IF:   You cough up bloody sputum that had not been present before.  You develop fever of 102 F (38.9 C) or greater.  You develop worsening pain at or near the incision site. MAKE SURE YOU:   Understand these instructions.  Will watch your condition.  Will get help right away if you are not doing well or get worse. Document Released: 07/28/2006 Document Revised: 06/09/2011 Document Reviewed: 09/28/2006 Exit[employer] Patient Information 2014 [employer], [place].   ________________________________________________________________________  WHAT IS A BLOOD TRANSFUSION? Blood Transfusion Information  A transfusion is the replacement of blood or some of its parts. Blood is made up of multiple cells which provide different functions.  Red blood cells carry oxygen and are used for blood loss replacement.  White blood cells fight against infection.  Platelets control bleeding.  Plasma helps clot blood.  Other blood products are available for specialized needs, such as hemophilia or other clotting disorders. BEFORE THE TRANSFUSION  Who gives blood for transfusions?   Healthy volunteers who are fully evaluated to make sure their blood is safe. This is blood bank blood. Transfusion therapy is the safest it has ever been in the practice of medicine. Before blood is taken from a donor, a complete history is taken to make sure that person has no history of diseases nor engages in risky social behavior (examples are intravenous drug use or sexual activity with multiple partners). The donor's travel history is screened to minimize risk of  transmitting infections, such as malaria. The donated blood is tested for signs of infectious diseases, such as HIV and hepatitis. The blood is then tested to be sure it is compatible with you in order to minimize the chance of a transfusion reaction. If you or a relative donates blood, this is often done in anticipation of surgery and is not appropriate for emergency situations. It takes many days to process the donated blood. RISKS AND COMPLICATIONS Although transfusion therapy is very safe and saves many lives, the main dangers of transfusion include:   Getting an infectious disease.  Developing a transfusion reaction. This is an allergic reaction to something in the blood you were given. Every precaution is taken to prevent this. The decision to have a blood transfusion has been considered carefully by your caregiver before blood is given. Blood is not given unless the benefits outweigh the risks. AFTER THE TRANSFUSION  Right after receiving a blood transfusion, you will usually feel much better and more energetic. This is especially true if your red blood cells have gotten low (anemic). The transfusion raises the level of the red blood cells which carry oxygen, and this usually causes an energy increase.  The nurse administering the transfusion will monitor you carefully for complications. HOME CARE INSTRUCTIONS  No special instructions are needed after a transfusion. You may find your energy is better. Speak with your caregiver about any limitations on activity for underlying diseases you may have. SEEK MEDICAL CARE IF:   Your condition is not improving after your transfusion.  You develop redness or  irritation at the intravenous (IV) site. SEEK IMMEDIATE MEDICAL CARE IF:  Any of the following symptoms occur over the next 12 hours:  Shaking chills.  You have a temperature by mouth above 102 F (38.9 C), not controlled by medicine.  Chest, back, or muscle pain.  People around you  feel you are not acting correctly or are confused.  Shortness of breath or difficulty breathing.  Dizziness and fainting.  You get a rash or develop hives.  You have a decrease in urine output.  Your urine turns a dark color or changes to pink, red, or brown. Any of the following symptoms occur over the next 10 days:  You have a temperature by mouth above 102 F (38.9 C), not controlled by medicine.  Shortness of breath.  Weakness after normal activity.  The white part of the eye turns yellow (jaundice).  You have a decrease in the amount of urine or are urinating less often.  Your urine turns a dark color or changes to pink, red, or brown. Document Released: 03/14/2000 Document Revised: 06/09/2011 Document Reviewed: 11/01/2007 [facility] Patient Information 2014 [place], [place].  _______________________________________________________________________

## 2019-06-29 ENCOUNTER — Encounter (HOSPITAL_COMMUNITY)
Admission: RE | Admit: 2019-06-29 | Discharge: 2019-06-29 | Disposition: A | Discharge status: Home / Self Care | Payer: Medicaid Other | Source: Ambulatory Visit | Attending: Orthopedic Surgery | Admitting: Orthopedic Surgery

## 2019-06-29 ENCOUNTER — Ambulatory Visit (HOSPITAL_COMMUNITY)
Admission: RE | Admit: 2019-06-29 | Discharge: 2019-06-29 | Disposition: A | Discharge status: Home / Self Care | Payer: Medicaid Other | Source: Ambulatory Visit | Attending: Orthopedic Surgery | Admitting: Orthopedic Surgery

## 2019-06-29 ENCOUNTER — Other Ambulatory Visit: Payer: Self-pay

## 2019-06-29 ENCOUNTER — Encounter (HOSPITAL_COMMUNITY): Payer: Self-pay

## 2019-06-29 DIAGNOSIS — Z01818 Encounter for other preprocedural examination: Secondary | ICD-10-CM | POA: Diagnosis present

## 2019-06-29 DIAGNOSIS — Z01811 Encounter for preprocedural respiratory examination: Secondary | ICD-10-CM

## 2019-06-29 HISTORY — DX: Unspecified osteoarthritis, unspecified site: M19.90

## 2019-06-29 LAB — SURGICAL PCR SCREEN
MRSA, PCR: NEGATIVE
Staphylococcus aureus: NEGATIVE

## 2019-06-29 LAB — URINALYSIS, ROUTINE W REFLEX MICROSCOPIC
Bilirubin Urine: NEGATIVE
Glucose, UA: NEGATIVE mg/dL
Hgb urine dipstick: NEGATIVE
Ketones, ur: 5 mg/dL — AB
Leukocytes,Ua: NEGATIVE
Nitrite: NEGATIVE
Protein, ur: NEGATIVE mg/dL
Specific Gravity, Urine: 1.019 (ref 1.005–1.030)
pH: 5 (ref 5.0–8.0)

## 2019-06-29 LAB — COMPREHENSIVE METABOLIC PANEL
ALT: 22 U/L (ref 0–44)
AST: 28 U/L (ref 15–41)
Albumin: 4.2 g/dL (ref 3.5–5.0)
Alkaline Phosphatase: 72 U/L (ref 38–126)
Anion gap: 8 (ref 5–15)
BUN: 12 mg/dL (ref 6–20)
CO2: 28 mmol/L (ref 22–32)
Calcium: 9.2 mg/dL (ref 8.9–10.3)
Chloride: 102 mmol/L (ref 98–111)
Creatinine, Ser: 1 mg/dL (ref 0.61–1.24)
GFR calc Af Amer: >60 mL/min (ref >60)
GFR calc non Af Amer: >60 mL/min (ref >60)
Glucose, Bld: 102 mg/dL — ABNORMAL HIGH (ref 70–99)
Potassium: 4.5 mmol/L (ref 3.5–5.1)
Sodium: 138 mmol/L (ref 135–145)
Total Bilirubin: 0.6 mg/dL (ref 0.3–1.2)
Total Protein: 7.5 g/dL (ref 6.5–8.1)

## 2019-06-29 LAB — CBC WITH DIFFERENTIAL/PLATELET
Abs Immature Granulocytes: 0.02 10*3/uL (ref 0.00–0.07)
Basophils Absolute: 0.1 10*3/uL (ref 0.0–0.1)
Basophils Relative: 1 %
Eosinophils Absolute: 0.2 10*3/uL (ref 0.0–0.5)
Eosinophils Relative: 2 %
HCT: 49.7 % (ref 39.0–52.0)
Hemoglobin: 16.1 g/dL (ref 13.0–17.0)
Immature Granulocytes: 0 %
Lymphocytes Relative: 30 %
Lymphs Abs: 2.4 10*3/uL (ref 0.7–4.0)
MCH: 29.7 pg (ref 26.0–34.0)
MCHC: 32.4 g/dL (ref 30.0–36.0)
MCV: 91.5 fL (ref 80.0–100.0)
Monocytes Absolute: 0.6 10*3/uL (ref 0.1–1.0)
Monocytes Relative: 7 %
Neutro Abs: 4.6 10*3/uL (ref 1.7–7.7)
Neutrophils Relative %: 60 %
Platelets: 183 10*3/uL (ref 150–400)
RBC: 5.43 MIL/uL (ref 4.22–5.81)
RDW: 13.5 % (ref 11.5–15.5)
WBC: 7.7 10*3/uL (ref 4.0–10.5)
nRBC: 0 % (ref 0.0–0.2)

## 2019-06-29 LAB — APTT: aPTT: 29 seconds (ref 24–36)

## 2019-06-29 LAB — PROTIME-INR
INR: 1 (ref 0.8–1.2)
Prothrombin Time: 12.7 seconds (ref 11.4–15.2)

## 2019-06-29 LAB — ABO/RH: ABO/RH(D): O POS

## 2019-06-30 ENCOUNTER — Encounter (HOSPITAL_COMMUNITY): Payer: Medicaid Other

## 2019-07-05 ENCOUNTER — Other Ambulatory Visit (HOSPITAL_COMMUNITY): Payer: Medicaid Other

## 2019-07-06 ENCOUNTER — Other Ambulatory Visit (HOSPITAL_COMMUNITY)
Admission: RE | Admit: 2019-07-06 | Discharge: 2019-07-06 | Disposition: A | Discharge status: Home / Self Care | Payer: Medicaid Other | Source: Ambulatory Visit | Attending: Orthopedic Surgery | Admitting: Orthopedic Surgery

## 2019-07-06 DIAGNOSIS — Z01812 Encounter for preprocedural laboratory examination: Secondary | ICD-10-CM | POA: Insufficient documentation

## 2019-07-06 DIAGNOSIS — Z20822 Contact with and (suspected) exposure to covid-19: Secondary | ICD-10-CM | POA: Insufficient documentation

## 2019-07-06 LAB — SARS CORONAVIRUS 2 (TAT 6-24 HRS): SARS Coronavirus 2: NEGATIVE

## 2019-07-07 MED ORDER — BUPIVACAINE LIPOSOME 1.3 % IJ SUSP
20.0000 mL | Freq: Once | INTRAMUSCULAR | Status: DC
  Filled 2019-07-07: qty 20

## 2019-07-07 NOTE — Anesthesia Preprocedure Evaluation (Addendum)
Anesthesia Evaluation  Patient identified by MRN, date of birth, ID band Patient awake    Reviewed: Allergy & Precautions, NPO status , Patient's Chart, lab work & pertinent test results  History of Anesthesia Complications (+) history of anesthetic complications  Airway Mallampati: II  TM Distance: >3 FB Neck ROM: Limited    Dental no notable dental hx.    Pulmonary neg pulmonary ROS,    Pulmonary exam normal breath sounds clear to auscultation       Cardiovascular hypertension, Pt. on medications negative cardio ROS Normal cardiovascular exam Rhythm:Regular Rate:Normal     Neuro/Psych  Headaches,  Neuromuscular disease negative psych ROS   GI/Hepatic negative GI ROS, Neg liver ROS,   Endo/Other  negative endocrine ROS  Renal/GU negative Renal ROS     Musculoskeletal negative musculoskeletal ROS (+)   Abdominal   Peds  Hematology negative hematology ROS (+)   Anesthesia Other Findings   Reproductive/Obstetrics negative OB ROS                             Anesthesia Physical  Anesthesia Plan  ASA: II  Anesthesia Plan: Spinal and MAC   Post-op Pain Management:  Regional for Post-op pain   Induction: Intravenous  PONV Risk Score and Plan: 2 and Treatment may vary due to age or medical condition and Midazolam  Airway Management Planned:   Additional Equipment:   Intra-op Plan:   Post-operative Plan: Extubation in OR  Informed Consent: I have reviewed the patients History and Physical, chart, labs and discussed the procedure including the risks, benefits and alternatives for the proposed anesthesia with the patient or authorized representative who has indicated his/her understanding and acceptance.     Dental advisory given  Plan Discussed with: CRNA and Anesthesiologist  Anesthesia Plan Comments: (  )        Anesthesia Quick Evaluation

## 2019-07-07 NOTE — H&P (Signed)
TOTAL KNEE ADMISSION H&P  Patient is being admitted for left unicompartmental knee arthroplasty.  Subjective:  Chief Complaint:left knee pain.  HPI: Melvin Sweeney, 59 y.o. male, has a history of pain and functional disability in the left knee due to arthritis and has failed non-surgical conservative treatments for greater than 12 weeks to includeNSAID's and/or analgesics, corticosteriod injections, viscosupplementation injections and activity modification.  Onset of symptoms was gradual, starting 3 years ago with gradually worsening course since that time. The patient noted prior procedures on the knee to include  arthroscopy and menisectomy on the left knee(s).  Patient currently rates pain in the left knee(s) at 9 out of 10 with activity. Patient has night pain, worsening of pain with activity and weight bearing, pain that interferes with activities of daily living, pain with passive range of motion and joint swelling.  Patient has evidence of subchondral sclerosis and joint space narrowing by imaging studies. This patient has had ffailure of all reasonable conservative care. There is no active infection.  Patient Active Problem List   Diagnosis Date Noted  . Arthritis of left acromioclavicular joint 11/19/2016  . MIGRAINE, UNSPEC., W/O INTRACTABLE MIGRAINE 05/28/2006  . HEADACHE, UNSPECIFIED 05/28/2006   Past Medical History:  Diagnosis Date  . Arthritis   . Complication of anesthesia    'scratched throat and spitting blood after RCR surgery'  . Hypertension   . Neuromuscular disorder [facility])     Past Surgical History:  Procedure Laterality Date  . FOOT FRACTURE SURGERY    . RESECTION DISTAL CLAVICAL Left 11/19/2016   Procedure: OPEN DISTAL CLAVICLE RESECTION;  Surgeon: Dorna Leitz, MD;  Location: [facility];  Service: Orthopedics;  Laterality: Left;  . ROTATOR CUFF REPAIR    . TOTAL KNEE ARTHROPLASTY  RIGHT   2007    Current Facility-Administered Medications   Medication Dose Route Frequency Provider Last Rate Last Admin  . [START ON 07/08/2019] bupivacaine liposome (EXPAREL) 1.3 % injection 266 mg  20 mL Other Once Dorna Leitz, MD       Current Outpatient Medications  Medication Sig Dispense Refill Last Dose  . Multiple Vitamins-Minerals (MULTIVITAMIN WITH MINERALS) tablet Take 1 tablet by mouth daily.     Marland Kitchen oxyCODONE-acetaminophen (PERCOCET/ROXICET) 5-325 MG tablet Take 1-2 tablets by mouth every 4 (four) hours as needed for severe pain. (Patient not taking: Reported on 06/27/2019) 30 tablet 0 Not Taking at Unknown time   Allergies  Allergen Reactions  . Ibuprofen     STOMACH ACHE EVEN IF TAKEN WITH FOOD    Social History   Tobacco Use  . Smoking status: Never Smoker  . Smokeless tobacco: Never Used  Substance Use Topics  . Alcohol use: No    No family history on file.   Review of Systems ROS: I have reviewed the patient's review of systems thoroughly and there are no positive responses as relates to the HPI. Objective:  Physical Exam  Vital signs in last 24 hours:   Well-developed well-nourished patient in no acute distress. Alert and oriented x3 HEENT:within normal limits Cardiac: Regular rate and rhythm Pulmonary: Lungs clear to auscultation Abdomen: Soft and nontender.  Normal active bowel sounds  Musculoskeletal: (left knee: Painful range of motion.  Limited range of motion.  No instability.  Trace effusion.  Mild varus alignment. Labs: Recent Results (from the past 2160 hour(s))  APTT     Status: None   Collection Time: 06/29/19  8:57 AM  Result Value Ref Range   aPTT  29 24 - 36 seconds    Comment: Performed at [facility], [address] [place], [place] [address]  CBC WITH DIFFERENTIAL     Status: None   Collection Time: 06/29/19  8:57 AM  Result Value Ref Range   WBC 7.7 4.0 - 10.5 K/uL   RBC 5.43 4.22 - 5.81 MIL/uL   Hemoglobin 16.1 13.0 - 17.0 g/dL   HCT 94.8 01.6 - 55.3 %   MCV 91.5  80.0 - 100.0 fL   MCH 29.7 26.0 - 34.0 pg   MCHC 32.4 30.0 - 36.0 g/dL   RDW 74.8 27.0 - 78.6 %   Platelets 183 150 - 400 K/uL   nRBC 0.0 0.0 - 0.2 %   Neutrophils Relative % 60 %   Neutro Abs 4.6 1.7 - 7.7 K/uL   Lymphocytes Relative 30 %   Lymphs Abs 2.4 0.7 - 4.0 K/uL   Monocytes Relative 7 %   Monocytes Absolute 0.6 0.1 - 1.0 K/uL   Eosinophils Relative 2 %   Eosinophils Absolute 0.2 0.0 - 0.5 K/uL   Basophils Relative 1 %   Basophils Absolute 0.1 0.0 - 0.1 K/uL   Immature Granulocytes 0 %   Abs Immature Granulocytes 0.02 0.00 - 0.07 K/uL    Comment: Performed at [facility], [address], [place], [place] [address]  Comprehensive metabolic panel     Status: Abnormal   Collection Time: 06/29/19  8:57 AM  Result Value Ref Range   Sodium 138 135 - 145 mmol/L   Potassium 4.5 3.5 - 5.1 mmol/L   Chloride 102 98 - 111 mmol/L   CO2 28 22 - 32 mmol/L   Glucose, Bld 102 (H) 70 - 99 mg/dL    Comment: Glucose reference range applies only to samples taken after fasting for at least 8 hours.   BUN 12 6 - 20 mg/dL   Creatinine, Ser 2.01 0.61 - 1.24 mg/dL   Calcium 9.2 8.9 - 00.7 mg/dL   Total Protein 7.5 6.5 - 8.1 g/dL   Albumin 4.2 3.5 - 5.0 g/dL   AST 28 15 - 41 U/L   ALT 22 0 - 44 U/L   Alkaline Phosphatase 72 38 - 126 U/L   Total Bilirubin 0.6 0.3 - 1.2 mg/dL   GFR calc non Af Amer >60 >60 mL/min   GFR calc Af Amer >60 >60 mL/min   Anion gap 8 5 - 15    Comment: Performed at [facility], [address] [place], [place] [address]  Protime-INR     Status: None   Collection Time: 06/29/19  8:57 AM  Result Value Ref Range   Prothrombin Time 12.7 11.4 - 15.2 seconds   INR 1.0 0.8 - 1.2    Comment: (NOTE) INR goal varies based on device and disease states. Performed at [facility], [address], [place], [place] [address]   Type and screen Order type and screen if day of surgery is less than 15 days from draw of  preadmission visit or order morning of surgery if day of surgery is greater than 6 days from preadmission visit.     Status: None   Collection Time: 06/29/19  8:57 AM  Result Value Ref Range   ABO/RH(D) O POS    Antibody Screen NEG    Sample Expiration 07/13/2019,2359    Extend sample reason      NO TRANSFUSIONS OR PREGNANCY IN THE PAST 3 MONTHS  Performed at [facility], [address], [place], [place] [address]   Urinalysis, Routine w reflex microscopic     Status: Abnormal   Collection Time: 06/29/19  8:57 AM  Result Value Ref Range   Color, Urine YELLOW YELLOW   APPearance CLEAR CLEAR   Specific Gravity, Urine 1.019 1.005 - 1.030   pH 5.0 5.0 - 8.0   Glucose, UA NEGATIVE NEGATIVE mg/dL   Hgb urine dipstick NEGATIVE NEGATIVE   Bilirubin Urine NEGATIVE NEGATIVE   Ketones, ur 5 (A) NEGATIVE mg/dL   Protein, ur NEGATIVE NEGATIVE mg/dL   Nitrite NEGATIVE NEGATIVE   Leukocytes,Ua NEGATIVE NEGATIVE    Comment: Performed at [facility], [address] [place], [place] [address]  Surgical pcr screen     Status: None   Collection Time: 06/29/19  8:57 AM   Specimen: Vein; Nasal Swab  Result Value Ref Range   MRSA, PCR NEGATIVE NEGATIVE   Staphylococcus aureus NEGATIVE NEGATIVE    Comment: (NOTE) The Xpert SA Assay (FDA approved for NASAL specimens in patients 1 years of age and older), is one component of a comprehensive surveillance program. It is not intended to diagnose infection nor to guide or monitor treatment. Performed at [facility], [address], [place], [place] [address]   ABO/Rh     Status: None   Collection Time: 06/29/19  8:57 AM  Result Value Ref Range   ABO/RH(D)      O POS Performed at [facility], [address], [place], [place] [address]   SARS CORONAVIRUS 2 (TAT 6-24 HRS) Nasopharyngeal Nasopharyngeal Swab     Status: None   Collection Time: 07/06/19  8:22 AM   Specimen:  Nasopharyngeal Swab  Result Value Ref Range   SARS Coronavirus 2 NEGATIVE NEGATIVE    Comment: (NOTE) SARS-CoV-2 target nucleic acids are NOT DETECTED. The SARS-CoV-2 RNA is generally detectable in upper and lower respiratory specimens during the acute phase of infection. Negative results do not preclude SARS-CoV-2 infection, do not rule out co-infections with other pathogens, and should not be used as the sole basis for treatment or other patient management decisions. Negative results must be combined with clinical observations, patient history, and epidemiological information. The expected result is Negative. Fact Sheet for Patients: HairSlick.no Fact Sheet for Healthcare Providers: quierodirigir.com This test is not yet approved or cleared by the [place] FDA and  has been authorized for detection and/or diagnosis of SARS-CoV-2 by FDA under an Emergency Use Authorization (EUA). This EUA will remain  in effect (meaning this test can be used) for the duration of the COVID-19 declaration under Section 56 4(b)(1) of the Act, 21 U.S.C. section 360bbb-3(b)(1), unless the authorization is terminated or revoked sooner. Performed at [facility], [address] [place], [place] [address]     Estimated body mass index is 28.87 kg/m as calculated from the following:   Height as of 06/29/19: 5\' 7"  (1.702 m).   Weight as of 06/29/19: 83.6 kg.   Imaging Review Plain radiographs demonstrate severe degenerative joint disease of the left knee(s). The overall alignment ismild varus. The bone quality appears to be good for age and reported activity level.      Assessment/Plan:  End stage arthritis, left knee   The patient history, physical examination, clinical judgment of the provider and imaging studies are consistent with end stage degenerative joint disease of the left knee(s) and uunicompartmental knee arthroplasty is  deemed  medically necessary. The treatment options including medical management, injection therapy arthroscopy and arthroplasty were discussed at length. The risks and benefits of unicompartmental knee arthroplasty were presented and reviewed. The risks due to aseptic loosening, infection, stiffness, patella tracking problems, thromboembolic complications and other imponderables were discussed. The patient acknowledged the explanation, agreed to proceed with the plan and consent was signed. Patient is being admitted for inpatient treatment for surgery, pain control, PT, OT, prophylactic antibiotics, VTE prophylaxis, progressive ambulation and ADL's and discharge planning. The patient is planning to be discharged home with home health services

## 2019-07-08 ENCOUNTER — Ambulatory Visit (HOSPITAL_COMMUNITY)
Admission: RE | Admit: 2019-07-08 | Discharge: 2019-07-08 | Disposition: A | Discharge status: Home / Self Care | Payer: Medicaid Other | Source: Ambulatory Visit | Attending: Orthopedic Surgery | Admitting: Orthopedic Surgery

## 2019-07-08 ENCOUNTER — Encounter (HOSPITAL_COMMUNITY): Payer: Self-pay | Admitting: Orthopedic Surgery

## 2019-07-08 ENCOUNTER — Ambulatory Visit (HOSPITAL_COMMUNITY): Payer: Medicaid Other | Admitting: Anesthesiology

## 2019-07-08 ENCOUNTER — Other Ambulatory Visit: Payer: Self-pay

## 2019-07-08 ENCOUNTER — Encounter (HOSPITAL_COMMUNITY)
Admission: RE | Disposition: A | Discharge status: Home / Self Care | Payer: Self-pay | Source: Ambulatory Visit | Attending: Orthopedic Surgery

## 2019-07-08 DIAGNOSIS — M19012 Primary osteoarthritis, left shoulder: Secondary | ICD-10-CM | POA: Insufficient documentation

## 2019-07-08 DIAGNOSIS — M25762 Osteophyte, left knee: Secondary | ICD-10-CM | POA: Diagnosis not present

## 2019-07-08 DIAGNOSIS — M25462 Effusion, left knee: Secondary | ICD-10-CM | POA: Insufficient documentation

## 2019-07-08 DIAGNOSIS — Z79899 Other long term (current) drug therapy: Secondary | ICD-10-CM | POA: Insufficient documentation

## 2019-07-08 DIAGNOSIS — Z886 Allergy status to analgesic agent status: Secondary | ICD-10-CM | POA: Insufficient documentation

## 2019-07-08 DIAGNOSIS — M2580 Other specified joint disorders, unspecified joint: Secondary | ICD-10-CM | POA: Diagnosis not present

## 2019-07-08 DIAGNOSIS — Z96651 Presence of right artificial knee joint: Secondary | ICD-10-CM | POA: Diagnosis not present

## 2019-07-08 DIAGNOSIS — M1712 Unilateral primary osteoarthritis, left knee: Secondary | ICD-10-CM | POA: Diagnosis present

## 2019-07-08 HISTORY — PX: PARTIAL KNEE ARTHROPLASTY: SHX2174

## 2019-07-08 LAB — TYPE AND SCREEN
ABO/RH(D): O POS
Antibody Screen: NEGATIVE

## 2019-07-08 SURGERY — ARTHROPLASTY, KNEE, UNICOMPARTMENTAL
Anesthesia: Monitor Anesthesia Care | Site: Knee | Laterality: Left

## 2019-07-08 MED ORDER — METHOCARBAMOL 500 MG PO TABS: 500.0000 mg | ORAL_TABLET | Freq: Four times a day (QID) | ORAL | Status: DC | PRN

## 2019-07-08 MED ORDER — ONDANSETRON HCL 4 MG/2ML IJ SOLN: 4.0000 mg | Freq: Once | INTRAMUSCULAR | Status: DC | PRN

## 2019-07-08 MED ORDER — TRANEXAMIC ACID-NACL 1000-0.7 MG/100ML-% IV SOLN
INTRAVENOUS | Status: AC
  Filled 2019-07-08: qty 100

## 2019-07-08 MED ORDER — POVIDONE-IODINE 10 % EX SWAB
2.0000 "application " | Freq: Once | CUTANEOUS | Status: AC
  Administered 2019-07-08: 2 via TOPICAL

## 2019-07-08 MED ORDER — OXYCODONE HCL 5 MG PO TABS
5.0000 mg | ORAL_TABLET | Freq: Once | ORAL | Status: AC | PRN
  Administered 2019-07-08: 5 mg via ORAL

## 2019-07-08 MED ORDER — TRANEXAMIC ACID-NACL 1000-0.7 MG/100ML-% IV SOLN
1000.0000 mg | Freq: Once | INTRAVENOUS | Status: AC
  Administered 2019-07-08: 1000 mg via INTRAVENOUS

## 2019-07-08 MED ORDER — ACETAMINOPHEN 160 MG/5ML PO SOLN: 325.0000 mg | ORAL | Status: DC | PRN

## 2019-07-08 MED ORDER — DOCUSATE SODIUM 100 MG PO CAPS: 100.0000 mg | ORAL_CAPSULE | Freq: Two times a day (BID) | ORAL | 0 refills | Status: AC

## 2019-07-08 MED ORDER — LACTATED RINGERS IV BOLUS
500.0000 mL | Freq: Once | INTRAVENOUS | Status: AC
  Administered 2019-07-08: 500 mL via INTRAVENOUS

## 2019-07-08 MED ORDER — LIDOCAINE HCL (CARDIAC) PF 100 MG/5ML IV SOSY
PREFILLED_SYRINGE | INTRAVENOUS | Status: DC | PRN
  Administered 2019-07-08: 60 mg via INTRATRACHEAL

## 2019-07-08 MED ORDER — DEXAMETHASONE SODIUM PHOSPHATE 10 MG/ML IJ SOLN
INTRAMUSCULAR | Status: DC | PRN
  Administered 2019-07-08: 10 mg

## 2019-07-08 MED ORDER — TIZANIDINE HCL 2 MG PO TABS: 2.0000 mg | ORAL_TABLET | Freq: Three times a day (TID) | ORAL | 0 refills | Status: DC | PRN

## 2019-07-08 MED ORDER — FENTANYL CITRATE (PF) 100 MCG/2ML IJ SOLN
INTRAMUSCULAR | Status: AC
  Filled 2019-07-08: qty 2

## 2019-07-08 MED ORDER — FENTANYL CITRATE (PF) 100 MCG/2ML IJ SOLN
INTRAMUSCULAR | Status: DC | PRN
  Administered 2019-07-08: 50 ug via INTRAVENOUS

## 2019-07-08 MED ORDER — TRANEXAMIC ACID-NACL 1000-0.7 MG/100ML-% IV SOLN
1000.0000 mg | INTRAVENOUS | Status: AC
  Administered 2019-07-08: 1000 mg via INTRAVENOUS
  Filled 2019-07-08: qty 100

## 2019-07-08 MED ORDER — SODIUM CHLORIDE 0.9 % IR SOLN
Status: DC | PRN
  Administered 2019-07-08: 1000 mL

## 2019-07-08 MED ORDER — MIDAZOLAM HCL 2 MG/2ML IJ SOLN
INTRAMUSCULAR | Status: AC
  Filled 2019-07-08: qty 2

## 2019-07-08 MED ORDER — PROPOFOL 500 MG/50ML IV EMUL
INTRAVENOUS | Status: DC | PRN
  Administered 2019-07-08: 75 ug/kg/min via INTRAVENOUS

## 2019-07-08 MED ORDER — CEFAZOLIN SODIUM-DEXTROSE 2-4 GM/100ML-% IV SOLN
2.0000 g | Freq: Four times a day (QID) | INTRAVENOUS | Status: DC
  Administered 2019-07-08: 2 g via INTRAVENOUS

## 2019-07-08 MED ORDER — STERILE WATER FOR IRRIGATION IR SOLN
Status: DC | PRN
  Administered 2019-07-08: 2000 mL

## 2019-07-08 MED ORDER — DEXAMETHASONE SODIUM PHOSPHATE 10 MG/ML IJ SOLN
INTRAMUSCULAR | Status: AC
  Filled 2019-07-08: qty 1

## 2019-07-08 MED ORDER — PROPOFOL 1000 MG/100ML IV EMUL
INTRAVENOUS | Status: AC
  Filled 2019-07-08: qty 100

## 2019-07-08 MED ORDER — BUPIVACAINE-EPINEPHRINE 0.5% -1:200000 IJ SOLN
INTRAMUSCULAR | Status: DC | PRN
  Administered 2019-07-08: 30 mL

## 2019-07-08 MED ORDER — BUPIVACAINE-EPINEPHRINE (PF) 0.5% -1:200000 IJ SOLN
INTRAMUSCULAR | Status: AC
  Filled 2019-07-08: qty 30

## 2019-07-08 MED ORDER — METHOCARBAMOL 500 MG IVPB - SIMPLE MED
500.0000 mg | Freq: Four times a day (QID) | INTRAVENOUS | Status: DC | PRN
  Administered 2019-07-08: 500 mg via INTRAVENOUS

## 2019-07-08 MED ORDER — SODIUM CHLORIDE (PF) 0.9 % IJ SOLN
INTRAMUSCULAR | Status: AC
  Filled 2019-07-08: qty 10

## 2019-07-08 MED ORDER — ONDANSETRON HCL 4 MG/2ML IJ SOLN
INTRAMUSCULAR | Status: DC | PRN
  Administered 2019-07-08: 4 mg via INTRAVENOUS

## 2019-07-08 MED ORDER — LACTATED RINGERS IV BOLUS
250.0000 mL | Freq: Once | INTRAVENOUS | Status: AC
  Administered 2019-07-08: 250 mL via INTRAVENOUS

## 2019-07-08 MED ORDER — CEFAZOLIN SODIUM-DEXTROSE 2-4 GM/100ML-% IV SOLN
2.0000 g | INTRAVENOUS | Status: AC
  Administered 2019-07-08: 2 g via INTRAVENOUS
  Filled 2019-07-08: qty 100

## 2019-07-08 MED ORDER — PHENYLEPHRINE HCL (PRESSORS) 10 MG/ML IV SOLN
INTRAVENOUS | Status: AC
  Filled 2019-07-08: qty 1

## 2019-07-08 MED ORDER — MEPERIDINE HCL 50 MG/ML IJ SOLN: 6.2500 mg | INTRAMUSCULAR | Status: DC | PRN

## 2019-07-08 MED ORDER — SODIUM CHLORIDE 0.9% FLUSH
INTRAVENOUS | Status: DC | PRN
  Administered 2019-07-08: 10 mL

## 2019-07-08 MED ORDER — OXYCODONE HCL 5 MG PO TABS
ORAL_TABLET | ORAL | Status: AC
  Filled 2019-07-08: qty 1

## 2019-07-08 MED ORDER — MIDAZOLAM HCL 5 MG/5ML IJ SOLN
INTRAMUSCULAR | Status: DC | PRN
  Administered 2019-07-08: 2 mg via INTRAVENOUS

## 2019-07-08 MED ORDER — PROPOFOL 500 MG/50ML IV EMUL
INTRAVENOUS | Status: DC | PRN
  Administered 2019-07-08: 60 mg via INTRAVENOUS

## 2019-07-08 MED ORDER — CHLORHEXIDINE GLUCONATE 4 % EX LIQD: 60.0000 mL | Freq: Once | CUTANEOUS | Status: DC

## 2019-07-08 MED ORDER — METHOCARBAMOL 500 MG IVPB - SIMPLE MED
INTRAVENOUS | Status: AC
  Filled 2019-07-08: qty 50

## 2019-07-08 MED ORDER — LACTATED RINGERS IV SOLN: INTRAVENOUS | Status: DC

## 2019-07-08 MED ORDER — ACETAMINOPHEN 325 MG PO TABS: 325.0000 mg | ORAL_TABLET | ORAL | Status: DC | PRN

## 2019-07-08 MED ORDER — ASPIRIN EC 325 MG PO TBEC: 325.0000 mg | DELAYED_RELEASE_TABLET | Freq: Two times a day (BID) | ORAL | 0 refills | Status: DC

## 2019-07-08 MED ORDER — OXYCODONE-ACETAMINOPHEN 5-325 MG PO TABS: 1.0000 | ORAL_TABLET | Freq: Four times a day (QID) | ORAL | 0 refills | Status: DC | PRN

## 2019-07-08 MED ORDER — OXYCODONE HCL 5 MG PO TABS: 5.0000 mg | ORAL_TABLET | ORAL | Status: DC | PRN

## 2019-07-08 MED ORDER — ONDANSETRON HCL 4 MG/2ML IJ SOLN
INTRAMUSCULAR | Status: AC
  Filled 2019-07-08: qty 2

## 2019-07-08 MED ORDER — OXYCODONE HCL 5 MG/5ML PO SOLN: 5.0000 mg | Freq: Once | ORAL | Status: AC | PRN

## 2019-07-08 MED ORDER — BUPIVACAINE LIPOSOME 1.3 % IJ SUSP
INTRAMUSCULAR | Status: DC | PRN
  Administered 2019-07-08: 20 mL

## 2019-07-08 MED ORDER — ROPIVACAINE HCL 7.5 MG/ML IJ SOLN
INTRAMUSCULAR | Status: DC | PRN
  Administered 2019-07-08: 30 mL via PERINEURAL

## 2019-07-08 MED ORDER — MEPIVACAINE HCL (PF) 2 % IJ SOLN
INTRAMUSCULAR | Status: DC | PRN
  Administered 2019-07-08: 70 mg via EPIDURAL

## 2019-07-08 MED ORDER — CEFAZOLIN SODIUM-DEXTROSE 2-4 GM/100ML-% IV SOLN
INTRAVENOUS | Status: AC
  Filled 2019-07-08: qty 100

## 2019-07-08 MED ORDER — FENTANYL CITRATE (PF) 100 MCG/2ML IJ SOLN
25.0000 ug | INTRAMUSCULAR | Status: DC | PRN
  Administered 2019-07-08 (×3): 50 ug via INTRAVENOUS

## 2019-07-08 SURGICAL SUPPLY — 66 items
APL SKNCLS STERI-STRIP NONHPOA (GAUZE/BANDAGES/DRESSINGS) ×1
BAG SPEC THK2 15X12 ZIP CLS (MISCELLANEOUS) ×1
BAG ZIPLOCK 12X15 (MISCELLANEOUS) ×3 IMPLANT
BENZOIN TINCTURE PRP APPL 2/3 (GAUZE/BANDAGES/DRESSINGS) ×3 IMPLANT
BLADE SAGITTAL 25.0X1.19X90 (BLADE) ×2 IMPLANT
BLADE SAGITTAL 25.0X1.19X90MM (BLADE) ×1
BLADE SAW RECIPROCATING 77.5 (BLADE) ×2 IMPLANT
BLADE SAW SGTL 13.0X1.19X90.0M (BLADE) ×3 IMPLANT
BNDG CMPR MED 10X6 ELC LF (GAUZE/BANDAGES/DRESSINGS) ×1
BNDG ELASTIC 6X10 VLCR STRL LF (GAUZE/BANDAGES/DRESSINGS) ×2 IMPLANT
BNDG ELASTIC 6X5.8 VLCR STR LF (GAUZE/BANDAGES/DRESSINGS) ×3 IMPLANT
BOOTIES KNEE HIGH SLOAN (MISCELLANEOUS) ×3 IMPLANT
BOWL SMART MIX CTS (DISPOSABLE) ×3 IMPLANT
BRNG TIB G 8 STRL KN LT MDL (Orthopedic Implant) ×1 IMPLANT
BSPLAT TIB G STRL KN LT MED TI (Orthopedic Implant) ×1 IMPLANT
CEMENT HV SMART SET (Cement) ×6 IMPLANT
CLOSURE WOUND 1/2 X4 (GAUZE/BANDAGES/DRESSINGS) ×1
COVER WAND RF STERILE (DRAPES) IMPLANT
CUFF TOURN SGL QUICK 34 (TOURNIQUET CUFF) ×3
CUFF TRNQT CYL 34X4.125X (TOURNIQUET CUFF) ×1 IMPLANT
DECANTER SPIKE VIAL GLASS SM (MISCELLANEOUS) IMPLANT
DRAPE U-SHAPE 47X51 STRL (DRAPES) ×3 IMPLANT
DRSG AQUACEL AG ADV 3.5X 6 (GAUZE/BANDAGES/DRESSINGS) ×2 IMPLANT
DRSG AQUACEL AG ADV 3.5X10 (GAUZE/BANDAGES/DRESSINGS) ×3 IMPLANT
DURAPREP 26ML APPLICATOR (WOUND CARE) ×3 IMPLANT
ELECT REM PT RETURN 15FT ADLT (MISCELLANEOUS) ×3 IMPLANT
GLOVE BIOGEL PI IND STRL 8 (GLOVE) ×2 IMPLANT
GLOVE BIOGEL PI INDICATOR 8 (GLOVE) ×4
GLOVE ECLIPSE 7.5 STRL STRAW (GLOVE) ×6 IMPLANT
GOWN STRL REUS W/TWL XL LVL3 (GOWN DISPOSABLE) ×6 IMPLANT
HANDPIECE INTERPULSE COAX TIP (DISPOSABLE) ×3
HOLDER FOLEY CATH W/STRAP (MISCELLANEOUS) IMPLANT
HOOD PEEL AWAY FLYTE STAYCOOL (MISCELLANEOUS) ×9 IMPLANT
IMMOBILIZER KNEE 20 (SOFTGOODS) ×3
IMMOBILIZER KNEE 20 THIGH 36 (SOFTGOODS) ×1 IMPLANT
INSERTER TIP PARTIAL KNEE (MISCELLANEOUS) ×2 IMPLANT
KIT TURNOVER KIT A (KITS) IMPLANT
MANIFOLD NEPTUNE II (INSTRUMENTS) ×3 IMPLANT
NEEDLE HYPO 22GX1.5 SAFETY (NEEDLE) ×3 IMPLANT
NS IRRIG 1000ML POUR BTL (IV SOLUTION) ×3 IMPLANT
PACK ICE MAXI GEL EZY WRAP (MISCELLANEOUS) ×3 IMPLANT
PACK TOTAL KNEE CUSTOM (KITS) ×3 IMPLANT
PADDING CAST ABS 6INX4YD NS (CAST SUPPLIES) ×2
PADDING CAST ABS COTTON 6X4 NS (CAST SUPPLIES) IMPLANT
PADDING CAST COTTON 6X4 STRL (CAST SUPPLIES) ×3 IMPLANT
PENCIL SMOKE EVACUATOR (MISCELLANEOUS) IMPLANT
PROTECTOR NERVE ULNAR (MISCELLANEOUS) ×3 IMPLANT
PSN PK CMT TIB LM SZ G KNEE (Orthopedic Implant) ×3 IMPLANT
PSN PK VE PLY LM SZ G KNEE (Orthopedic Implant) ×3 IMPLANT
SET HNDPC FAN SPRY TIP SCT (DISPOSABLE) ×1 IMPLANT
STAPLER VISISTAT 35W (STAPLE) IMPLANT
STRIP CLOSURE SKIN 1/2X4 (GAUZE/BANDAGES/DRESSINGS) ×1 IMPLANT
SURFACE ARTC PRSNA PLY SZ G KN (Orthopedic Implant) IMPLANT
SURFACE ARTC PRSNA TIB SZ G KN (Orthopedic Implant) IMPLANT
SUT MNCRL AB 3-0 PS2 18 (SUTURE) ×3 IMPLANT
SUT VIC AB 0 CT1 36 (SUTURE) ×3 IMPLANT
SUT VIC AB 1 CT1 36 (SUTURE) ×6 IMPLANT
SUT VIC AB 2-0 CT1 27 (SUTURE) ×6
SUT VIC AB 2-0 CT1 TAPERPNT 27 (SUTURE) ×2 IMPLANT
SYR 3ML LL SCALE MARK (SYRINGE) IMPLANT
SYR CONTROL 10ML LL (SYRINGE) ×6 IMPLANT
SYSTEM KNEE PART FEM LM SZ 4 (Joint) ×2 IMPLANT
TRAY FOLEY MTR SLVR 16FR STAT (SET/KITS/TRAYS/PACK) ×3 IMPLANT
WATER STERILE IRR 1000ML POUR (IV SOLUTION) ×3 IMPLANT
WRAP KNEE MAXI GEL POST OP (GAUZE/BANDAGES/DRESSINGS) ×2 IMPLANT
YANKAUER SUCT BULB TIP 10FT TU (MISCELLANEOUS) ×3 IMPLANT

## 2019-07-08 NOTE — Evaluation (Signed)
Physical Therapy Evaluation Patient Details Name: NICKLAS MCSWEENEY MRN: [id number] DOB: [birth year] Today's Date: 07/08/2019   History of Present Illness  Patient is 59 y.o. male s/p Lt UKA on 07/08/19 with PMH significant for HTN, OA, neuromuscular disorder, Rt TKA in 2007.    Clinical Impression  AHSAN ESTERLINE is a 59 y.o. male POD 0 s/p Lt UKA. Patient reports independence with mobility at baseline. Patient is now limited by functional impairments (see PT problem list below) and requires supervision for transfers and gait with RW. Patient was able to ambulate ~140 feet with RW and supervision and cues for safe walker management. Patient educated on safe sequencing for stair mobility and verbalized safe guarding position for people assisting with mobility. Patient instructed in exercises to facilitate ROM and circulation. Patient will benefit from continued skilled PT interventions to address impairments and progress towards PLOF. Patient has met mobility goals at adequate level for discharge home; will continue to follow if pt continues acute stay to progress towards Mod I goals.     Follow Up Recommendations Follow surgeon's recommendation for DC plan and follow-up therapies    Equipment Recommendations  Rolling walker with 5" wheels(delivered in PACU)    Recommendations for Other Services       Precautions / Restrictions Precautions Precautions: Fall Restrictions Weight Bearing Restrictions: No      Mobility  Bed Mobility Overal bed mobility: Needs Assistance Bed Mobility: Supine to Sit     Supine to sit: Supervision     General bed mobility comments: no assist needed, some extra time and effort by pt required.  Transfers Overall transfer level: Needs assistance Equipment used: Rolling walker (2 wheeled) Transfers: Sit to/from Stand Sit to Stand: Supervision         General transfer comment: cues for hand placement/technique, no assist required and pt steady with  rising.  Ambulation/Gait Ambulation/Gait assistance: Min guard;Supervision Gait Distance (Feet): 140 Feet Assistive device: Rolling walker (2 wheeled) Gait Pattern/deviations: Step-to pattern;Decreased stride length Gait velocity: decreased   General Gait Details: cues for safe step pattern and no overt LOB and pt maintained safe proximity throughout.   Stairs            Wheelchair Mobility    Modified Rankin (Stroke Patients Only)       Balance Overall balance assessment: Needs assistance Sitting-balance support: Feet supported Sitting balance-Leahy Scale: Good     Standing balance support: During functional activity;Bilateral upper extremity supported Standing balance-Leahy Scale: Fair              Pertinent Vitals/Pain Pain Assessment: 0-10 Pain Score: 8  Pain Location: Lt knee Pain Descriptors / Indicators: Aching;Sore Pain Intervention(s): Limited activity within patient's tolerance;Monitored during session;Premedicated before session    Home Living Family/patient expects to be discharged to:: Private residence Living Arrangements: Spouse/significant other;Children Available Help at Discharge: Family Type of Home: House Home Access: Stairs to enter;Ramped entrance Entrance Stairs-Rails: Can reach both Entrance Stairs-Number of Steps: 2 Home Layout: One level Home Equipment: Shower seat;Cane - single point;Crutches;Grab bars - tub/shower;Grab bars - toilet      Prior Function Level of Independence: Independent               Hand Dominance   Dominant Hand: Left    Extremity/Trunk Assessment   Upper Extremity Assessment Upper Extremity Assessment: Overall WFL for tasks assessed    Lower Extremity Assessment Lower Extremity Assessment: Overall WFL for tasks assessed;LLE deficits/detail LLE Deficits / Details: good quad  activation, no extensor lag with SLR, 4/5 for MMT. LLE Sensation: WNL LLE Coordination: WNL    Cervical / Trunk  Assessment Cervical / Trunk Assessment: Normal  Communication   Communication: No difficulties  Cognition Arousal/Alertness: Awake/alert Behavior During Therapy: WFL for tasks assessed/performed Overall Cognitive Status: Within Functional Limits for tasks assessed         General Comments      Exercises Total Joint Exercises Ankle Circles/Pumps: AROM;Both;Seated;10 reps Quad Sets: AROM;Left;5 reps;Seated Short Arc Quad: AROM;Left;Other reps (comment)(2) Heel Slides: AROM;Left;AAROM;5 reps;Seated Hip ABduction/ADduction: AROM;Left;Other reps (comment);Seated(2) Straight Leg Raises: AROM;Left;Other reps (comment);Supine(2) Long Arc Quad: AROM;Left;5 reps;Seated Knee Flexion: AROM;AAROM;Left;5 reps;Seated   Assessment/Plan    PT Assessment Patient needs continued PT services  PT Problem List Decreased strength;Decreased range of motion;Decreased activity tolerance;Decreased balance;Decreased mobility;Decreased knowledge of use of DME       PT Treatment Interventions DME instruction;Gait training;Stair training;Functional mobility training;Therapeutic activities;Therapeutic exercise;Balance training;Patient/family education    PT Goals (Current goals can be found in the Care Plan section)  Acute Rehab PT Goals Patient Stated Goal: to go home today PT Goal Formulation: With patient Time For Goal Achievement: 07/11/19 Potential to Achieve Goals: Good    Frequency 7X/week    AM-PAC PT "6 Clicks" Mobility  Outcome Measure Help needed turning from your back to your side while in a flat bed without using bedrails?: None Help needed moving from lying on your back to sitting on the side of a flat bed without using bedrails?: None Help needed moving to and from a bed to a chair (including a wheelchair)?: A Little Help needed standing up from a chair using your arms (e.g., wheelchair or bedside chair)?: A Little Help needed to walk in hospital room?: A Little Help needed climbing  3-5 steps with a railing? : A Little 6 Click Score: 20    End of Session Equipment Utilized During Treatment: Gait belt Activity Tolerance: Patient tolerated treatment well Patient left: in chair;with call bell/phone within reach Nurse Communication: Mobility status PT Visit Diagnosis: Muscle weakness (generalized) (M62.81);Difficulty in walking, not elsewhere classified (R26.2)    Time: 1315-1350 PT Time Calculation (min) (ACUTE ONLY): 35 min   Charges:   PT Evaluation $PT Eval Low Complexity: 1 Low          Verner Mould, DPT Physical Therapist with [facility] [phone]  07/08/2019 2:50 PM

## 2019-07-08 NOTE — Anesthesia Procedure Notes (Signed)
Date/Time: 07/08/2019 7:25 AM Performed by: Thornell Mule, CRNA Oxygen Delivery Method: Simple face mask

## 2019-07-08 NOTE — Anesthesia Procedure Notes (Signed)
Spinal  Patient location during procedure: OR Start time: 07/08/2019 7:30 AM End time: 07/08/2019 7:32 AM Staffing Performed: resident/CRNA  Anesthesiologist: Janeece Riggers, MD Resident/CRNA: Glory Buff, CRNA Preanesthetic Checklist Completed: patient identified, IV checked, site marked, risks and benefits discussed, surgical consent, monitors and equipment checked, pre-op evaluation and timeout performed Spinal Block Patient position: sitting Prep: DuraPrep Patient monitoring: heart rate, cardiac monitor, continuous pulse ox and blood pressure Approach: midline Location: L3-4 Injection technique: single-shot Needle Needle type: Pencan  Needle gauge: 24 G Needle length: 9 cm Assessment Sensory level: T6 Additional Notes Kit expiration date checked and verified.  Sterile prep and drape, skin local with 1% lidocaine, stick x 1, - heme, - paraesthesia, + CSF pre and post injection, patient tolerated procedure well.

## 2019-07-08 NOTE — Discharge Instructions (Signed)

## 2019-07-08 NOTE — Brief Op Note (Signed)
07/08/2019  2:21 PM  PATIENT:  Melvin Sweeney  59 y.o. male  PRE-OPERATIVE DIAGNOSIS:  LT KNEE DEGENERATIVE JOINT DISEASE MEDIAL COMPARTMENT  POST-OPERATIVE DIAGNOSIS:  LT KNEE DEGENERATIVE JOINT DISEASE MEDIAL   PROCEDURE:  Procedure(s): KNEE UNICOMPARTMENTAL KNEE REPLACEMENT (Left)  SURGEON:  Surgeon(s) and Role:    Jodi Geralds, MD - Primary  PHYSICIAN ASSISTANT:   ASSISTANTS: jim bethune   ANESTHESIA:   spinal  EBL:  25 mL   BLOOD ADMINISTERED:none  DRAINS: none   LOCAL MEDICATIONS USED:  MARCAINE    and OTHER experel  SPECIMEN:  No Specimen  DISPOSITION OF SPECIMEN:  N/A  COUNTS:  YES  TOURNIQUET:   Total Tourniquet Time Documented: Thigh (Left) - 67 minutes Total: Thigh (Left) - 67 minutes   DICTATION: .Other Dictation: Dictation Number [id number]  PLAN OF CARE: Discharge to home after PACU  PATIENT DISPOSITION:  PACU - hemodynamically stable.   Delay start of Pharmacological VTE agent (>24hrs) due to surgical blood loss or risk of bleeding: no

## 2019-07-08 NOTE — Interval H&P Note (Signed)
History and Physical Interval Note:  07/08/2019 7:21 AM  Melvin Sweeney  has presented today for surgery, with the diagnosis of LT KNEE DEGENERATIVE JOINT DISEASE MEDIAL COMPARTMENT.  The various methods of treatment have been discussed with the patient and family. After consideration of risks, benefits and other options for treatment, the patient has consented to  Procedure(s): KNEE UNICOMPARTMENTAL KNEE REPLACEMENT (Left) as a surgical intervention.  The patient's history has been reviewed, patient examined, no change in status, stable for surgery.  I have reviewed the patient's chart and labs.  Questions were answered to the patient's satisfaction.     Harvie Junior

## 2019-07-08 NOTE — Transfer of Care (Signed)
Immediate Anesthesia Transfer of Care Note  Patient: Melvin Sweeney  Procedure(s) Performed: KNEE UNICOMPARTMENTAL KNEE REPLACEMENT (Left Knee)  Patient Location: PACU  Anesthesia Type:MAC and Spinal  Level of Consciousness: drowsy, patient cooperative and responds to stimulation  Airway & Oxygen Therapy: Patient Spontanous Breathing and Patient connected to face mask oxygen  Post-op Assessment: Report given to RN and Post -op Vital signs reviewed and stable  Post vital signs: Reviewed and stable  Last Vitals:  Vitals Value Taken Time  BP 151/85 07/08/19 0945  Temp    Pulse 66 07/08/19 0945  Resp 12 07/08/19 0945  SpO2 100 % 07/08/19 0945  Vitals shown include unvalidated device data.  Last Pain:  Vitals:   07/08/19 0614  TempSrc: Oral  PainSc:       Patients Stated Pain Goal: 5 (28/31/51 7616)  Complications: No apparent anesthesia complications

## 2019-07-08 NOTE — Anesthesia Procedure Notes (Addendum)
Anesthesia Regional Block: Adductor canal block   Pre-Anesthetic Checklist: ,, timeout performed, Correct Patient, Correct Site, Correct Laterality, Correct Procedure, Correct Position, site marked, Risks and benefits discussed,  Surgical consent,  Pre-op evaluation,  At surgeon's request and post-op pain management  Laterality: Left  Prep: chloraprep       Needles:  Injection technique: Single-shot  Needle Type: Echogenic Stimulator Needle     Needle Length: 5cm  Needle Gauge: 22     Additional Needles:   Procedures:, nerve stimulator,,, ultrasound used (permanent image in chart),,,,  Narrative:  Start time: 07/08/2019 6:52 AM End time: 07/08/2019 6:58 AM Injection made incrementally with aspirations every 5 mL.  Performed by: Personally  Anesthesiologist: Bethena Midget, MD  Additional Notes: Functioning IV was confirmed and monitors were applied.  A 58mm 22ga Arrow echogenic stimulator needle was used. Sterile prep and drape,hand hygiene and sterile gloves were used. Ultrasound guidance: relevant anatomy identified, needle position confirmed, local anesthetic spread visualized around nerve(s)., vascular puncture avoided.  Image printed for medical record. Negative aspiration and negative test dose prior to incremental administration of local anesthetic. The patient tolerated the procedure well.

## 2019-07-09 NOTE — Op Note (Signed)
NAME: Melvin Sweeney, [name]:[id number] ACCOUNT [id number] DATE OF BIRTH:03/14/1961 FACILITY: [facility] LOCATION: [facility] PHYSICIAN:Garo Heidelberg L. Arie Gable, MD  OPERATIVE REPORT  DATE OF PROCEDURE:  07/08/2019  PREOPERATIVE DIAGNOSIS:  End-stage degenerative joint disease, left knee medial compartment.  POSTOPERATIVE DIAGNOSIS:  End-stage degenerative joint disease, left knee medial compartment.   PROCEDURE:  Left unicompartmental knee replacement.   SURGEON:  Alta Corning, MD  ASSISTANT:  Gaspar Skeeters PA-C, was present for the entire case and assisted by retraction of tissues, manipulation of the leg, and closing to minimize OR time.  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  He ultimately got significantly stiff after that and ultimately went on to revision because of long-term pain.  We talked about treatment  options on his left side when he had end-stage arthritis of the medial compartment only.  After discussion, we felt that unicompartmental knee replacement made sense.  He was brought to the operating room for this procedure.  DESCRIPTION OF PROCEDURE:  The patient brought to the operating room after adequate anesthesia was obtained with a spinal anesthetic, placed on the operating table.  Left leg was prepped and draped in sterile fashion.  Following this, the leg was  exsanguinated, blood pressure tourniquet inflated to 250 mmHg.  Following this, an incision was made just medial to midline.  Subcutaneous tissue to the level of the extensor mechanism and a medial parapatellar arthrotomy was undertaken, care being taken  not to injure the articular cartilage of the way in.  A small synovectomy was performed on the medial side and medial releases were performed exposing the medial side.  Osteophytes were removed medially and laterally and centrally.  Once this was done,  attention was turned towards  placement of the tibial jig.  The tibial jig was taken 4 mm off that tibial plateau and in line with the long axis and has a 7-degree posterior slope built in.  Once this was done, the tibial cut is made with the initial  sagittal saw, followed by the horizontal saw.  This piece was removed to the back table.  Following this, a 9 mm spacer blocks easily put into place and the drill pin is used to lock it in place.  A provisional posterior cut is made through this jig.   Once the jig was removed, attention was turned towards sizing the distal femur.  It sized to a 4.  It was held in place and then cuts were made as well as the drills for this.  Tibia sized to a size G and that is locked in place with a pin and then  provisional holes are drilled to lock in the tibial cut at this point.  Once that was completed, the trials were put in place and knee put through a range of motion with a 9 spacer. I could not get the shim in with a 9 spacer, but we went ahead and  irrigated everything, cemented in the final components, a size G tibia and a size 4 femur.  We put in a 9 spacer just to help pressurize cement.  I could not really get the 2 mm shim in in extension, I could get it in and flex to 3 mm in flexion, but I  felt that an 8 was going to be a better choice for him.  Once the cement was completely hardened and allowing all excess bone cement  had been removed, the final 8 poly was opened and placed.  Knee put through a range of motion, excellent stability and  range of motion were achieved at this point.  Following this, attention was turned towards closure.  The knee was irrigated and suctioned dry.  The medial parapatellar arthrotomy was closed with 1 Vicryl running, the skin with 0 and 2-0 Vicryl and 3-0  Monocryl subcuticular.  Benzoin, Steri-Strips, dry sterile compressive dressing was applied and the patient was taken to recovery and noted to be in satisfactory condition.  Estimated blood loss for  procedure was minimal  PN/NUANCE  D:07/08/2019 T:07/09/2019 JOB:[id number]

## 2019-07-11 ENCOUNTER — Encounter: Payer: Self-pay | Admitting: *Deleted

## 2019-07-18 ENCOUNTER — Encounter: Payer: Self-pay | Admitting: Anesthesiology

## 2019-07-18 NOTE — Anesthesia Postprocedure Evaluation (Signed)
Anesthesia Post Note  Patient: Melvin Sweeney  Procedure(s) Performed: KNEE UNICOMPARTMENTAL KNEE REPLACEMENT (Left Knee)     Patient location during evaluation: PACU Anesthesia Type: MAC Level of consciousness: awake and alert Pain management: pain level controlled Vital Signs Assessment: post-procedure vital signs reviewed and stable Respiratory status: spontaneous breathing, nonlabored ventilation, respiratory function stable and patient connected to nasal cannula oxygen Cardiovascular status: stable and blood pressure returned to baseline Postop Assessment: no apparent nausea or vomiting Anesthetic complications: no    Last Vitals:  Vitals:   07/08/19 1300 07/08/19 1403  BP: (!) 160/91 (!) 163/79  Pulse: 87 74  Resp: 18 16  Temp:  36.4 C  SpO2: 98% 99%    Last Pain:  Vitals:   07/08/19 1403  TempSrc:   PainSc: 6                  Rhya Shan,[place]

## 2020-10-08 ENCOUNTER — Encounter (HOSPITAL_COMMUNITY): Payer: Medicaid Other

## 2020-10-10 ENCOUNTER — Encounter (HOSPITAL_COMMUNITY): Payer: Self-pay

## 2020-10-10 NOTE — Progress Notes (Addendum)
COVID Vaccine Completed: Yes x2 Date COVID Vaccine completed: 10-28-19 &  11-30-19 Has received booster: COVID vaccine manufacturer:     Moderna    Date of COVID positive in last 90 days:  N/A  PCP - Marianna Payment, PA Cardiologist - N/A  Chest x-ray - N/A EKG - 09-19-20 at PCP's office.  Requested 10-16-20 Stress Test - N/A ECHO - N/A Cardiac Cath - N/A Pacemaker/ICD device last checked: Spinal Cord Stimulator:  Sleep Study - N/A CPAP -   Fasting Blood Sugar - N/A Checks Blood Sugar _____ times a day  Blood Thinner Instructions: N/A Aspirin Instructions:  Last Dose:  Activity level:  Can go up a flight of stairs and perform activities of daily living without stopping and without symptoms of chest pain or shortness of breath.  Some limitations due to knee pain       Anesthesia review:  N/A  Patient denies shortness of breath, fever, cough and chest pain at PAT appointment (completed over the phone)   Patient verbalized understanding of instructions that were given to them at the PAT appointment. Patient was also instructed that they will need to review over the PAT instructions again at home before surgery.

## 2020-10-10 NOTE — Patient Instructions (Addendum)
DUE TO COVID-19 ONLY ONE VISITOR IS ALLOWED TO COME WITH YOU AND STAY IN THE WAITING ROOM ONLY DURING PRE OP AND PROCEDURE.   **NO VISITORS ARE ALLOWED IN THE SHORT STAY AREA OR RECOVERY ROOM!!**  IF YOU WILL BE ADMITTED INTO THE HOSPITAL YOU ARE ALLOWED ONLY TWO SUPPORT PEOPLE DURING VISITATION HOURS ONLY (10AM -8PM)   The support person(s) may change daily. The support person(s) must pass our screening, gel in and out, and wear a mask at all times, including in the patient's room. Patients must also wear a mask when staff or their support person are in the room.  No visitors under the age of 80. Any visitor under the age of 56 must be accompanied by an adult.    COVID SWAB TESTING MUST BE COMPLETED ON:  Tuesday, 10-23-20 @ 9:10 AM   [address] [place], [place] [address]   You are not required to quarantine, however you are required to wear a well-fitted mask when you are out and around people not in your household.  Hand Hygiene often Do NOT share personal items Notify your provider if you are in close contact with someone who has COVID or you develop fever 100.4 or greater, new onset of sneezing, cough, sore throat, shortness of breath or body aches.        Your procedure is scheduled on:  Thursday, 10-25-20   Report to [facility] Main  Entrance   Report to Short Stay at 5:15 AM   [facility])   Call this number if you have problems the morning of surgery [phone]   Do not eat food :After Midnight.   May have liquids until 4:15 AM day of surgery  CLEAR LIQUID DIET  Foods Allowed                                                                     Foods Excluded  Water, Black Coffee and tea, regular and decaf               liquids that you cannot  Plain Jell-O in any flavor  (No red)                                     see through such as: Fruit ices (not with fruit pulp)                                      milk, soups, orange juice              Iced  Popsicles (No red)                                      All solid food                                   Apple juices Sports drinks like Gatorade (No red) Lightly seasoned clear broth or  consume(fat free) Sugar, honey syrup      Complete one G2 drink the morning of surgery at 4:15 AM the day of surgery.       The day of surgery:  Drink ONE (1) G2 by am the morning of surgery. Drink in one sitting. Do not sip.  This drink was given to you during your hospital  pre-op appointment visit. Nothing else to drink after completing the G2.          If you have questions, please contact your surgeon's office.     Oral Hygiene is also important to reduce your risk of infection.                                    Remember - BRUSH YOUR TEETH THE MORNING OF SURGERY WITH YOUR REGULAR TOOTHPASTE   Do NOT smoke after Midnight   Take these medicines the morning of surgery with A SIP OF WATER: Amlodipine, Atorvastatin                              You may not have any metal on your body including  jewelry, and body piercing             Do not wear lotions, powders, cologne, or deodorant              Men may shave face and neck.   Do not bring valuables to the hospital. [facility] IS NOT  RESPONSIBLE   FOR VALUABLES.   Contacts, dentures or bridgework may not be worn into surgery.   Bring small overnight bag day of surgery.   Please read over the following fact sheets you were given: IF YOU HAVE QUESTIONS ABOUT YOUR PRE OP INSTRUCTIONS PLEASE CALL [phone]   [place] - Preparing for Surgery Before surgery, you can play an important role.  Because skin is not sterile, your skin needs to be as free of germs as possible.  You can reduce the number of germs on your skin by washing with CHG (chlorahexidine gluconate) soap before surgery.  CHG is an antiseptic cleaner which kills germs and bonds with the skin to continue killing germs even after washing. Please DO NOT use if you have an  allergy to CHG or antibacterial soaps.  If your skin becomes reddened/irritated stop using the CHG and inform your nurse when you arrive at Short Stay. Do not shave (including legs and underarms) for at least 48 hours prior to the first CHG shower.  You may shave your face/neck.  Please follow these instructions carefully:  1.  Shower with CHG Soap the night before surgery and the  morning of surgery.  2.  If you choose to wash your hair, wash your hair first as usual with your normal  shampoo.  3.  After you shampoo, rinse your hair and body thoroughly to remove the shampoo.                             4.  Use CHG as you would any other liquid soap.  You can apply chg directly to the skin and wash.  Gently with a scrungie or clean washcloth.  5.  Apply the CHG Soap to your body ONLY FROM THE NECK DOWN.   Do   not use on  face/ open                           Wound or open sores. Avoid contact with eyes, ears mouth and   genitals (private parts).                       Wash face,  Genitals (private parts) with your normal soap.             6.  Wash thoroughly, paying special attention to the area where your    surgery  will be performed.  7.  Thoroughly rinse your body with warm water from the neck down.  8.  DO NOT shower/wash with your normal soap after using and rinsing off the CHG Soap.                9.  Pat yourself dry with a clean towel.            10.  Wear clean pajamas.            11.  Place clean sheets on your bed the night of your first shower and do not  sleep with pets. Day of Surgery : Do not apply any lotions/deodorants the morning of surgery.  Please wear clean clothes to the hospital/surgery center.  FAILURE TO FOLLOW THESE INSTRUCTIONS MAY RESULT IN THE CANCELLATION OF YOUR SURGERY  PATIENT SIGNATURE_________________________________  NURSE SIGNATURE__________________________________  ________________________________________________________________________    Rogelia Mire  An incentive spirometer is a tool that can help keep your lungs clear and active. This tool measures how well you are filling your lungs with each breath. Taking long deep breaths may help reverse or decrease the chance of developing breathing (pulmonary) problems (especially infection) following: A long period of time when you are unable to move or be active. BEFORE THE PROCEDURE  If the spirometer includes an indicator to show your best effort, your nurse or respiratory therapist will set it to a desired goal. If possible, sit up straight or lean slightly forward. Try not to slouch. Hold the incentive spirometer in an upright position. INSTRUCTIONS FOR USE  Sit on the edge of your bed if possible, or sit up as far as you can in bed or on a chair. Hold the incentive spirometer in an upright position. Breathe out normally. Place the mouthpiece in your mouth and seal your lips tightly around it. Breathe in slowly and as deeply as possible, raising the piston or the ball toward the top of the column. Hold your breath for 3-5 seconds or for as long as possible. Allow the piston or ball to fall to the bottom of the column. Remove the mouthpiece from your mouth and breathe out normally. Rest for a few seconds and repeat Steps 1 through 7 at least 10 times every 1-2 hours when you are awake. Take your time and take a few normal breaths between deep breaths. The spirometer may include an indicator to show your best effort. Use the indicator as a goal to work toward during each repetition. After each set of 10 deep breaths, practice coughing to be sure your lungs are clear. If you have an incision (the cut made at the time of surgery), support your incision when coughing by placing a pillow or rolled up towels firmly against it. Once you are able to get out of bed, walk around indoors and cough well. You may stop using the  incentive spirometer when instructed by your caregiver.  RISKS AND  COMPLICATIONS Take your time so you do not get dizzy or light-headed. If you are in pain, you may need to take or ask for pain medication before doing incentive spirometry. It is harder to take a deep breath if you are having pain. AFTER USE Rest and breathe slowly and easily. It can be helpful to keep track of a log of your progress. Your caregiver can provide you with a simple table to help with this. If you are using the spirometer at home, follow these instructions: SEEK MEDICAL CARE IF:  You are having difficultly using the spirometer. You have trouble using the spirometer as often as instructed. Your pain medication is not giving enough relief while using the spirometer. You develop fever of 100.5 F (38.1 C) or higher. SEEK IMMEDIATE MEDICAL CARE IF:  You cough up bloody sputum that had not been present before. You develop fever of 102 F (38.9 C) or greater. You develop worsening pain at or near the incision site. MAKE SURE YOU:  Understand these instructions. Will watch your condition. Will get help right away if you are not doing well or get worse. Document Released: 07/28/2006 Document Revised: 06/09/2011 Document Reviewed: 09/28/2006 [employer] Patient Information 2014 [employer], [place].   ________________________________________________________________________  WHAT IS A BLOOD TRANSFUSION? Blood Transfusion Information  A transfusion is the replacement of blood or some of its parts. Blood is made up of multiple cells which provide different functions. Red blood cells carry oxygen and are used for blood loss replacement. White blood cells fight against infection. Platelets control bleeding. Plasma helps clot blood. Other blood products are available for specialized needs, such as hemophilia or other clotting disorders. BEFORE THE TRANSFUSION  Who gives blood for transfusions?  Healthy volunteers who are fully evaluated to make sure their blood is safe. This is blood bank  blood. Transfusion therapy is the safest it has ever been in the practice of medicine. Before blood is taken from a donor, a complete history is taken to make sure that person has no history of diseases nor engages in risky social behavior (examples are intravenous drug use or sexual activity with multiple partners). The donor's travel history is screened to minimize risk of transmitting infections, such as malaria. The donated blood is tested for signs of infectious diseases, such as HIV and hepatitis. The blood is then tested to be sure it is compatible with you in order to minimize the chance of a transfusion reaction. If you or a relative donates blood, this is often done in anticipation of surgery and is not appropriate for emergency situations. It takes many days to process the donated blood. RISKS AND COMPLICATIONS Although transfusion therapy is very safe and saves many lives, the main dangers of transfusion include:  Getting an infectious disease. Developing a transfusion reaction. This is an allergic reaction to something in the blood you were given. Every precaution is taken to prevent this. The decision to have a blood transfusion has been considered carefully by your caregiver before blood is given. Blood is not given unless the benefits outweigh the risks. AFTER THE TRANSFUSION Right after receiving a blood transfusion, you will usually feel much better and more energetic. This is especially true if your red blood cells have gotten low (anemic). The transfusion raises the level of the red blood cells which carry oxygen, and this usually causes an energy increase. The nurse administering the transfusion will monitor you carefully for  complications. HOME CARE INSTRUCTIONS  No special instructions are needed after a transfusion. You may find your energy is better. Speak with your caregiver about any limitations on activity for underlying diseases you may have. SEEK MEDICAL CARE IF:  Your  condition is not improving after your transfusion. You develop redness or irritation at the intravenous (IV) site. SEEK IMMEDIATE MEDICAL CARE IF:  Any of the following symptoms occur over the next 12 hours: Shaking chills. You have a temperature by mouth above 102 F (38.9 C), not controlled by medicine. Chest, back, or muscle pain. People around you feel you are not acting correctly or are confused. Shortness of breath or difficulty breathing. Dizziness and fainting. You get a rash or develop hives. You have a decrease in urine output. Your urine turns a dark color or changes to pink, red, or brown. Any of the following symptoms occur over the next 10 days: You have a temperature by mouth above 102 F (38.9 C), not controlled by medicine. Shortness of breath. Weakness after normal activity. The white part of the eye turns yellow (jaundice). You have a decrease in the amount of urine or are urinating less often. Your urine turns a dark color or changes to pink, red, or brown. Document Released: 03/14/2000 Document Revised: 06/09/2011 Document Reviewed: 11/01/2007 [facility] Patient Information 2014 [place], [place].  _______________________________________________________________________

## 2020-10-16 ENCOUNTER — Encounter (HOSPITAL_COMMUNITY)
Admission: RE | Admit: 2020-10-16 | Discharge: 2020-10-16 | Disposition: A | Discharge status: Home / Self Care | Payer: Medicaid Other | Source: Ambulatory Visit | Attending: Anesthesiology | Admitting: Anesthesiology

## 2020-10-16 ENCOUNTER — Encounter (HOSPITAL_COMMUNITY): Payer: Self-pay

## 2020-10-16 ENCOUNTER — Other Ambulatory Visit: Payer: Self-pay

## 2020-10-16 HISTORY — DX: Prediabetes: R73.03

## 2020-10-17 ENCOUNTER — Encounter (HOSPITAL_COMMUNITY)
Admission: RE | Admit: 2020-10-17 | Discharge: 2020-10-17 | Disposition: A | Discharge status: Home / Self Care | Payer: Medicaid Other | Source: Ambulatory Visit | Attending: Orthopedic Surgery | Admitting: Orthopedic Surgery

## 2020-10-17 DIAGNOSIS — Z01812 Encounter for preprocedural laboratory examination: Secondary | ICD-10-CM | POA: Diagnosis present

## 2020-10-17 LAB — COMPREHENSIVE METABOLIC PANEL
ALT: 19 U/L (ref 0–44)
AST: 22 U/L (ref 15–41)
Albumin: 4.4 g/dL (ref 3.5–5.0)
Alkaline Phosphatase: 76 U/L (ref 38–126)
Anion gap: 9 (ref 5–15)
BUN: 9 mg/dL (ref 6–20)
CO2: 27 mmol/L (ref 22–32)
Calcium: 9.4 mg/dL (ref 8.9–10.3)
Chloride: 105 mmol/L (ref 98–111)
Creatinine, Ser: 0.84 mg/dL (ref 0.61–1.24)
GFR, Estimated: >60 mL/min (ref >60)
Glucose, Bld: 98 mg/dL (ref 70–99)
Potassium: 4 mmol/L (ref 3.5–5.1)
Sodium: 141 mmol/L (ref 135–145)
Total Bilirubin: 0.5 mg/dL (ref 0.3–1.2)
Total Protein: 7.5 g/dL (ref 6.5–8.1)

## 2020-10-17 LAB — CBC
HCT: 47 % (ref 39.0–52.0)
Hemoglobin: 15.1 g/dL (ref 13.0–17.0)
MCH: 28.8 pg (ref 26.0–34.0)
MCHC: 32.1 g/dL (ref 30.0–36.0)
MCV: 89.5 fL (ref 80.0–100.0)
Platelets: 149 10*3/uL — ABNORMAL LOW (ref 150–400)
RBC: 5.25 MIL/uL (ref 4.22–5.81)
RDW: 13.4 % (ref 11.5–15.5)
WBC: 7.2 10*3/uL (ref 4.0–10.5)
nRBC: 0 % (ref 0.0–0.2)

## 2020-10-17 LAB — APTT: aPTT: 29 seconds (ref 24–36)

## 2020-10-17 LAB — HEMOGLOBIN A1C
Hgb A1c MFr Bld: 6.1 % — ABNORMAL HIGH (ref 4.8–5.6)
Mean Plasma Glucose: 128.37 mg/dL

## 2020-10-17 LAB — PROTIME-INR
INR: 0.9 (ref 0.8–1.2)
Prothrombin Time: 12.5 seconds (ref 11.4–15.2)

## 2020-10-17 LAB — SURGICAL PCR SCREEN
MRSA, PCR: NEGATIVE
Staphylococcus aureus: NEGATIVE

## 2020-10-17 NOTE — Progress Notes (Signed)
Patient is aware to hold Suboxone three days prior to surgery.

## 2020-10-23 ENCOUNTER — Other Ambulatory Visit (HOSPITAL_COMMUNITY)
Admission: RE | Admit: 2020-10-23 | Discharge: 2020-10-23 | Disposition: A | Discharge status: Home / Self Care | Payer: Medicaid Other | Source: Ambulatory Visit | Attending: Orthopedic Surgery | Admitting: Orthopedic Surgery

## 2020-10-23 DIAGNOSIS — Z01812 Encounter for preprocedural laboratory examination: Secondary | ICD-10-CM | POA: Diagnosis present

## 2020-10-23 DIAGNOSIS — Z20822 Contact with and (suspected) exposure to covid-19: Secondary | ICD-10-CM | POA: Insufficient documentation

## 2020-10-23 LAB — SARS CORONAVIRUS 2 (TAT 6-24 HRS): SARS Coronavirus 2: NEGATIVE

## 2020-10-24 NOTE — H&P (Signed)
TOTAL KNEE REVISION ADMISSION H&P  Patient is being admitted for conversion from left partial to left total knee arthroplasty   Subjective:  Chief Complaint:Progressive left knee osteoarthritis and pain, after left partial knee replacement  HPI: Melvin Sweeney, 60 y.o. male, has a history of medial unicompartmental arthroplasty by Dr. Luiz Blare on 07/08/19. He reports issues with the knee from the time of surgery back in April 2021. The patient currently has a diagnosis of left knee progressive osteoarthritis and has failed conservative treatments including activity modification, cortisone injection. The patient has had previous UKR on the left knee. The patient denies an active infection.  Patient Active Problem List   Diagnosis Date Noted   Primary osteoarthritis of left knee 07/08/2019   Arthritis of left acromioclavicular joint 11/19/2016   MIGRAINE, UNSPEC., W/O INTRACTABLE MIGRAINE 05/28/2006   HEADACHE, UNSPECIFIED 05/28/2006   Past Medical History:  Diagnosis Date   Arthritis    Complication of anesthesia    'scratched throat and spitting blood after RCR surgery'   Hypertension    Neuromuscular disorder (HCC)    Pre-diabetes     Past Surgical History:  Procedure Laterality Date   FOOT FRACTURE SURGERY     PARTIAL KNEE ARTHROPLASTY Left 07/08/2019   Procedure: KNEE UNICOMPARTMENTAL KNEE REPLACEMENT;  Surgeon: Jodi Geralds, MD;  Location: [facility];  Service: Orthopedics;  Laterality: Left;   RESECTION DISTAL CLAVICAL Left 11/19/2016   Procedure: OPEN DISTAL CLAVICLE RESECTION;  Surgeon: Jodi Geralds, MD;  Location: [facility];  Service: Orthopedics;  Laterality: Left;   ROTATOR CUFF REPAIR     TOTAL KNEE ARTHROPLASTY  RIGHT   2007    No current facility-administered medications for this encounter.   Current Outpatient Medications  Medication Sig Dispense Refill Last Dose   amLODipine (NORVASC) 10 MG tablet Take 10 mg by mouth daily.      atorvastatin (LIPITOR) 20  MG tablet Take 20 mg by mouth daily.      Buprenorphine HCl-Naloxone HCl 8-2 MG FILM Place 1 Film under the tongue 3 (three) times daily.      calcium carbonate (TUMS - DOSED IN MG ELEMENTAL CALCIUM) 500 MG chewable tablet Chew 1 tablet by mouth daily as needed for indigestion or heartburn.      diclofenac Sodium (VOLTAREN) 1 % GEL Apply 2 g topically 4 (four) times daily as needed for pain.      Ketotifen Fumarate (ALLERGY EYE DROPS OP) Place 1 drop into both eyes daily as needed (allergies/redness).      Multiple Vitamins-Minerals (MULTIVITAMIN WITH MINERALS) tablet Take 1 tablet by mouth daily.      triamcinolone cream (KENALOG) 0.1 % Apply 1 application topically 2 (two) times daily as needed for itching.      aspirin EC 325 MG tablet Take 1 tablet (325 mg total) by mouth 2 (two) times daily after a meal. Take x 1 month post op to decrease risk of blood clots. (Patient not taking: No sig reported) 60 tablet 0 Completed Course   docusate sodium (COLACE) 100 MG capsule Take 1 capsule (100 mg total) by mouth 2 (two) times daily. (Patient not taking: No sig reported) 30 capsule 0 Completed Course   oxyCODONE-acetaminophen (PERCOCET/ROXICET) 5-325 MG tablet Take 1-2 tablets by mouth every 6 (six) hours as needed for severe pain. (Patient not taking: No sig reported) 40 tablet 0 Completed Course   tiZANidine (ZANAFLEX) 2 MG tablet Take 1 tablet (2 mg total) by mouth every 8 (eight) hours as  needed for muscle spasms. (Patient not taking: No sig reported) 40 tablet 0 Completed Course   Allergies  Allergen Reactions   Ibuprofen     STOMACH ACHE EVEN IF TAKEN WITH FOOD    Social History   Tobacco Use   Smoking status: Never   Smokeless tobacco: Never  Substance Use Topics   Alcohol use: No    No family history on file.    Review of Systems  Constitutional:  Negative for chills and fever.  Respiratory:  Negative for cough and shortness of breath.   Cardiovascular:  Negative for chest pain.   Gastrointestinal:  Negative for nausea and vomiting.  Musculoskeletal:  Positive for arthralgias.    Objective:  Physical Exam  Pleasant 60 year old male awake alert and oriented. He is in no acute distress. He walks in the office without assist device.  Left knee exam: No palpable effusion, warmth erythema Surgical incision is healed Full knee extension and flexion over 110 degrees Tenderness to palpation about the knee Stable and intact medial lateral collateral ligaments  Right knee exam: Healed surgical incision from revision surgery without warmth erythema Full knee extension but flexion over 90 degrees No evidence of any ligament instability  Vital signs in last 24 hours:    Labs:  Estimated body mass index is 27.88 kg/m as calculated from the following:   Height as of 10/16/20: 5\' 7"  (1.702 m).   Weight as of 10/16/20: 80.7 kg.  Imaging Review  Standing AP of both knees and standing lateral of the left knee were ordered and reviewed. His left medial femoral and tibial components appear to be stable and intact. On the lateral radiograph there is evidence of moderate patellofemoral arthritic change. On the standing AP of both knees his right revised tibial and femoral components appear to be intact and stable.   Assessment/Plan:  End stage arthritis, left knee(s) with failed previous arthroplasty.   The patient history, physical examination, clinical judgment of the provider and imaging studies are consistent with end stage degenerative joint disease of the left knee(s), previous total knee arthroplasty. Revision total knee arthroplasty is deemed medically necessary. The treatment options including medical management, injection therapy, arthroscopy and revision arthroplasty were discussed at length. The risks and benefits of revision total knee arthroplasty were presented and reviewed. The risks due to aseptic loosening, infection, stiffness, patella tracking problems,  thromboembolic complications and other imponderables were discussed. The patient acknowledged the explanation, agreed to proceed with the plan and consent was signed. Patient is being admitted for inpatient treatment for surgery, pain control, PT, OT, prophylactic antibiotics, VTE prophylaxis, progressive ambulation and ADL's and discharge planning.The patient is planning to be discharged  home.  Therapy Plans: outpatient therapy at [facility] OPPT Disposition: Home with wife Planned DVT Prophylaxis: aspirin 81mg  BID DME needed: walker, 3-n-1 PCP: [facility], awaiting clearance TXA: IV Allergies: ibuprofen - GI irritation Anesthesia Concerns: none BMI: 27.4 Last HgbA1c: Not diabetic.  Other: - Post-op urinary retention after last surgery -- will use flomax - On buprenorphine/naloxone for hx of opioid addiction -- Instructions for how to take this peri-operatively will be sent with his clearance from his PCP  --- Will reduce to Suboxone 8 BID -- Will do oxy in addition 5-10 q4h  Marianna Payment, PA-C Orthopedic Surgery [facility] [place] [phone]

## 2020-10-24 NOTE — Anesthesia Preprocedure Evaluation (Addendum)
Anesthesia Evaluation  Patient identified by MRN, date of birth, ID band Patient awake    Reviewed: Allergy & Precautions, NPO status , Patient's Chart, lab work & pertinent test results  Airway Mallampati: II  TM Distance: >3 FB Neck ROM: Full    Dental no notable dental hx. (+) Teeth Intact, Dental Advisory Given   Pulmonary neg pulmonary ROS,    Pulmonary exam normal breath sounds clear to auscultation       Cardiovascular hypertension, Pt. on medications Normal cardiovascular exam Rhythm:Regular Rate:Normal     Neuro/Psych  Headaches, negative psych ROS   GI/Hepatic negative GI ROS, Neg liver ROS,   Endo/Other  negative endocrine ROS  Renal/GU negative Renal ROS  negative genitourinary   Musculoskeletal  (+) Arthritis ,   Abdominal   Peds  Hematology negative hematology ROS (+)   Anesthesia Other Findings   Reproductive/Obstetrics                            Anesthesia Physical Anesthesia Plan  ASA: 2  Anesthesia Plan: Spinal and Regional   Post-op Pain Management:  Regional for Post-op pain   Induction:   PONV Risk Score and Plan: 1 and Treatment may vary due to age or medical condition, Propofol infusion, Midazolam, Ondansetron and Dexamethasone  Airway Management Planned: Natural Airway  Additional Equipment:   Intra-op Plan:   Post-operative Plan:   Informed Consent: I have reviewed the patients History and Physical, chart, labs and discussed the procedure including the risks, benefits and alternatives for the proposed anesthesia with the patient or authorized representative who has indicated his/her understanding and acceptance.     Dental advisory given  Plan Discussed with: CRNA  Anesthesia Plan Comments:         Anesthesia Quick Evaluation

## 2020-10-25 ENCOUNTER — Inpatient Hospital Stay (HOSPITAL_COMMUNITY)
Admission: AD | Admit: 2020-10-25 | Discharge: 2020-10-26 | DRG: 467 | Disposition: A | Discharge status: Home / Self Care | Payer: Medicaid Other | Attending: Orthopedic Surgery | Admitting: Orthopedic Surgery

## 2020-10-25 ENCOUNTER — Ambulatory Visit (HOSPITAL_COMMUNITY): Payer: Medicaid Other | Admitting: Anesthesiology

## 2020-10-25 ENCOUNTER — Encounter (HOSPITAL_COMMUNITY)
Admission: AD | Disposition: A | Discharge status: Home / Self Care | Payer: Self-pay | Source: Home / Self Care | Attending: Orthopedic Surgery

## 2020-10-25 ENCOUNTER — Encounter (HOSPITAL_COMMUNITY): Payer: Self-pay | Admitting: Orthopedic Surgery

## 2020-10-25 ENCOUNTER — Other Ambulatory Visit: Payer: Self-pay

## 2020-10-25 DIAGNOSIS — G8929 Other chronic pain: Secondary | ICD-10-CM | POA: Diagnosis present

## 2020-10-25 DIAGNOSIS — R7303 Prediabetes: Secondary | ICD-10-CM | POA: Diagnosis present

## 2020-10-25 DIAGNOSIS — F1120 Opioid dependence, uncomplicated: Secondary | ICD-10-CM | POA: Diagnosis present

## 2020-10-25 DIAGNOSIS — Y792 Prosthetic and other implants, materials and accessory orthopedic devices associated with adverse incidents: Secondary | ICD-10-CM | POA: Diagnosis present

## 2020-10-25 DIAGNOSIS — I10 Essential (primary) hypertension: Secondary | ICD-10-CM | POA: Diagnosis present

## 2020-10-25 DIAGNOSIS — R339 Retention of urine, unspecified: Secondary | ICD-10-CM | POA: Diagnosis not present

## 2020-10-25 DIAGNOSIS — T8489XA Other specified complication of internal orthopedic prosthetic devices, implants and grafts, initial encounter: Secondary | ICD-10-CM | POA: Diagnosis present

## 2020-10-25 DIAGNOSIS — M2242 Chondromalacia patellae, left knee: Secondary | ICD-10-CM | POA: Diagnosis present

## 2020-10-25 DIAGNOSIS — M1712 Unilateral primary osteoarthritis, left knee: Secondary | ICD-10-CM | POA: Diagnosis present

## 2020-10-25 DIAGNOSIS — Z886 Allergy status to analgesic agent status: Secondary | ICD-10-CM | POA: Diagnosis not present

## 2020-10-25 DIAGNOSIS — Z96659 Presence of unspecified artificial knee joint: Secondary | ICD-10-CM

## 2020-10-25 DIAGNOSIS — Z79899 Other long term (current) drug therapy: Secondary | ICD-10-CM

## 2020-10-25 DIAGNOSIS — Z96652 Presence of left artificial knee joint: Secondary | ICD-10-CM

## 2020-10-25 HISTORY — PX: CONVERSION TO TOTAL KNEE: SHX5785

## 2020-10-25 LAB — TYPE AND SCREEN
ABO/RH(D): O POS
Antibody Screen: NEGATIVE

## 2020-10-25 SURGERY — CONVERSION, ARTHROPLASTY, KNEE, PARTIAL, TO TOTAL KNEE ARTHROPLASTY
Anesthesia: Regional | Site: Knee | Laterality: Left

## 2020-10-25 MED ORDER — BISACODYL 10 MG RE SUPP: 10.0000 mg | Freq: Every day | RECTAL | Status: DC | PRN

## 2020-10-25 MED ORDER — MEPIVACAINE HCL (PF) 2 % IJ SOLN: INTRAMUSCULAR | Status: DC | PRN

## 2020-10-25 MED ORDER — FENTANYL CITRATE (PF) 100 MCG/2ML IJ SOLN
INTRAMUSCULAR | Status: DC | PRN
  Administered 2020-10-25 (×2): 50 ug via INTRAVENOUS

## 2020-10-25 MED ORDER — HYDROMORPHONE HCL 1 MG/ML IJ SOLN
0.5000 mg | INTRAMUSCULAR | Status: DC | PRN
  Administered 2020-10-25 – 2020-10-26 (×4): 1 mg via INTRAVENOUS
  Filled 2020-10-25 (×3): qty 1

## 2020-10-25 MED ORDER — PHENOL 1.4 % MT LIQD: 1.0000 | OROMUCOSAL | Status: DC | PRN

## 2020-10-25 MED ORDER — PHENYLEPHRINE HCL (PRESSORS) 10 MG/ML IV SOLN
INTRAVENOUS | Status: AC
  Filled 2020-10-25: qty 1

## 2020-10-25 MED ORDER — PROPOFOL 10 MG/ML IV BOLUS
INTRAVENOUS | Status: AC
  Filled 2020-10-25: qty 20

## 2020-10-25 MED ORDER — BUPRENORPHINE HCL-NALOXONE HCL 8-2 MG SL SUBL
1.0000 | SUBLINGUAL_TABLET | Freq: Two times a day (BID) | SUBLINGUAL | Status: DC
  Administered 2020-10-25 – 2020-10-26 (×2): 1 via SUBLINGUAL
  Filled 2020-10-25 (×2): qty 1

## 2020-10-25 MED ORDER — POVIDONE-IODINE 10 % EX SWAB: 2.0000 "application " | Freq: Once | CUTANEOUS | Status: DC

## 2020-10-25 MED ORDER — OXYCODONE HCL 5 MG PO TABS
ORAL_TABLET | ORAL | Status: AC
  Filled 2020-10-25: qty 2

## 2020-10-25 MED ORDER — DEXAMETHASONE SODIUM PHOSPHATE 10 MG/ML IJ SOLN
INTRAMUSCULAR | Status: AC
  Filled 2020-10-25: qty 1

## 2020-10-25 MED ORDER — BUPIVACAINE-EPINEPHRINE (PF) 0.25% -1:200000 IJ SOLN
INTRAMUSCULAR | Status: AC
  Filled 2020-10-25: qty 30

## 2020-10-25 MED ORDER — ORAL CARE MOUTH RINSE: 15.0000 mL | Freq: Once | OROMUCOSAL | Status: AC

## 2020-10-25 MED ORDER — SODIUM CHLORIDE 0.9 % IV SOLN
2.0000 g | Freq: Four times a day (QID) | INTRAVENOUS | Status: AC
  Administered 2020-10-25 (×2): 2 g via INTRAVENOUS
  Filled 2020-10-25 (×2): qty 2

## 2020-10-25 MED ORDER — PHENYLEPHRINE 40 MCG/ML (10ML) SYRINGE FOR IV PUSH (FOR BLOOD PRESSURE SUPPORT)
PREFILLED_SYRINGE | INTRAVENOUS | Status: AC
  Filled 2020-10-25: qty 20

## 2020-10-25 MED ORDER — MIDAZOLAM HCL 2 MG/2ML IJ SOLN
INTRAMUSCULAR | Status: AC
  Filled 2020-10-25: qty 2

## 2020-10-25 MED ORDER — TRANEXAMIC ACID-NACL 1000-0.7 MG/100ML-% IV SOLN
1000.0000 mg | Freq: Once | INTRAVENOUS | Status: AC
  Administered 2020-10-25: 1000 mg via INTRAVENOUS

## 2020-10-25 MED ORDER — PHENYLEPHRINE 40 MCG/ML (10ML) SYRINGE FOR IV PUSH (FOR BLOOD PRESSURE SUPPORT)
PREFILLED_SYRINGE | INTRAVENOUS | Status: DC | PRN
  Administered 2020-10-25 (×3): 80 ug via INTRAVENOUS

## 2020-10-25 MED ORDER — SODIUM CHLORIDE 0.9 % IV SOLN
2.0000 g | INTRAVENOUS | Status: AC
  Administered 2020-10-25: 2 g via INTRAVENOUS
  Filled 2020-10-25: qty 2

## 2020-10-25 MED ORDER — ONDANSETRON HCL 4 MG PO TABS: 4.0000 mg | ORAL_TABLET | Freq: Four times a day (QID) | ORAL | Status: DC | PRN

## 2020-10-25 MED ORDER — TRANEXAMIC ACID-NACL 1000-0.7 MG/100ML-% IV SOLN
1000.0000 mg | INTRAVENOUS | Status: AC
  Administered 2020-10-25: 1000 mg via INTRAVENOUS
  Filled 2020-10-25 (×2): qty 100

## 2020-10-25 MED ORDER — OXYCODONE HCL 5 MG PO TABS
5.0000 mg | ORAL_TABLET | ORAL | Status: DC | PRN
Start: 2020-10-25 — End: 2020-10-26
  Administered 2020-10-25: 5 mg via ORAL
  Filled 2020-10-25: qty 1

## 2020-10-25 MED ORDER — HYDROMORPHONE HCL 1 MG/ML IJ SOLN
INTRAMUSCULAR | Status: AC
  Filled 2020-10-25: qty 1

## 2020-10-25 MED ORDER — CALCIUM CARBONATE ANTACID 500 MG PO CHEW
400.0000 mg | CHEWABLE_TABLET | Freq: Three times a day (TID) | ORAL | Status: DC | PRN
  Administered 2020-10-25 – 2020-10-26 (×2): 400 mg via ORAL
  Filled 2020-10-25 (×2): qty 2

## 2020-10-25 MED ORDER — WATER FOR IRRIGATION, STERILE IR SOLN
Status: DC | PRN
  Administered 2020-10-25: 2000 mL

## 2020-10-25 MED ORDER — SODIUM CHLORIDE 0.9 % IV SOLN: INTRAVENOUS | Status: DC

## 2020-10-25 MED ORDER — FENTANYL CITRATE (PF) 100 MCG/2ML IJ SOLN: 25.0000 ug | INTRAMUSCULAR | Status: DC | PRN

## 2020-10-25 MED ORDER — KETOTIFEN FUMARATE 0.025 % OP SOLN: 1.0000 [drp] | Freq: Every day | OPHTHALMIC | Status: DC | PRN

## 2020-10-25 MED ORDER — AMLODIPINE BESYLATE 10 MG PO TABS
10.0000 mg | ORAL_TABLET | Freq: Every day | ORAL | Status: DC
  Administered 2020-10-26: 10 mg via ORAL
  Filled 2020-10-25: qty 1

## 2020-10-25 MED ORDER — MEPIVACAINE HCL (PF) 2 % IJ SOLN
INTRAMUSCULAR | Status: DC | PRN
  Administered 2020-10-25: 2.5 mL via INTRATHECAL

## 2020-10-25 MED ORDER — SODIUM CHLORIDE (PF) 0.9 % IJ SOLN
INTRAMUSCULAR | Status: DC | PRN
  Administered 2020-10-25: 30 mL

## 2020-10-25 MED ORDER — SODIUM CHLORIDE (PF) 0.9 % IJ SOLN
INTRAMUSCULAR | Status: AC
  Filled 2020-10-25: qty 10

## 2020-10-25 MED ORDER — ASPIRIN 81 MG PO CHEW
81.0000 mg | CHEWABLE_TABLET | Freq: Two times a day (BID) | ORAL | Status: DC
  Administered 2020-10-25 – 2020-10-26 (×2): 81 mg via ORAL
  Filled 2020-10-25 (×2): qty 1

## 2020-10-25 MED ORDER — FENTANYL CITRATE (PF) 100 MCG/2ML IJ SOLN
INTRAMUSCULAR | Status: AC
  Filled 2020-10-25: qty 2

## 2020-10-25 MED ORDER — CHLORHEXIDINE GLUCONATE 0.12 % MT SOLN
15.0000 mL | Freq: Once | OROMUCOSAL | Status: AC
Start: 2020-10-25 — End: 2020-10-25
  Administered 2020-10-25: 15 mL via OROMUCOSAL

## 2020-10-25 MED ORDER — ACETAMINOPHEN 500 MG PO TABS
1000.0000 mg | ORAL_TABLET | Freq: Once | ORAL | Status: AC
  Administered 2020-10-25: 1000 mg via ORAL
  Filled 2020-10-25: qty 2

## 2020-10-25 MED ORDER — KETOROLAC TROMETHAMINE 30 MG/ML IJ SOLN
INTRAMUSCULAR | Status: DC | PRN
  Administered 2020-10-25: 30 mg

## 2020-10-25 MED ORDER — TAMSULOSIN HCL 0.4 MG PO CAPS
0.4000 mg | ORAL_CAPSULE | Freq: Every day | ORAL | Status: DC
  Administered 2020-10-25: 0.4 mg via ORAL
  Filled 2020-10-25: qty 1

## 2020-10-25 MED ORDER — KETOROLAC TROMETHAMINE 30 MG/ML IJ SOLN
INTRAMUSCULAR | Status: AC
  Filled 2020-10-25: qty 1

## 2020-10-25 MED ORDER — KETOROLAC TROMETHAMINE 15 MG/ML IJ SOLN
15.0000 mg | Freq: Four times a day (QID) | INTRAMUSCULAR | Status: DC
  Administered 2020-10-25 – 2020-10-26 (×4): 15 mg via INTRAVENOUS
  Filled 2020-10-25 (×4): qty 1

## 2020-10-25 MED ORDER — METOCLOPRAMIDE HCL 5 MG/ML IJ SOLN
INTRAMUSCULAR | Status: AC
  Filled 2020-10-25: qty 2

## 2020-10-25 MED ORDER — DOCUSATE SODIUM 100 MG PO CAPS
100.0000 mg | ORAL_CAPSULE | Freq: Two times a day (BID) | ORAL | Status: DC
  Administered 2020-10-25 – 2020-10-26 (×2): 100 mg via ORAL
  Filled 2020-10-25 (×2): qty 1

## 2020-10-25 MED ORDER — PHENYLEPHRINE HCL-NACL 10-0.9 MG/250ML-% IV SOLN
INTRAVENOUS | Status: DC | PRN
Start: 2020-10-25 — End: 2020-10-25
  Administered 2020-10-25: 25 ug/min via INTRAVENOUS

## 2020-10-25 MED ORDER — ACETAMINOPHEN 325 MG PO TABS: 325.0000 mg | ORAL_TABLET | Freq: Four times a day (QID) | ORAL | Status: DC | PRN

## 2020-10-25 MED ORDER — OXYCODONE HCL 5 MG PO TABS
10.0000 mg | ORAL_TABLET | ORAL | Status: DC | PRN
  Administered 2020-10-25 (×2): 10 mg via ORAL
  Administered 2020-10-26 (×2): 15 mg via ORAL
  Filled 2020-10-25 (×2): qty 3
  Filled 2020-10-25: qty 2

## 2020-10-25 MED ORDER — OXYCODONE HCL 5 MG PO TABS
ORAL_TABLET | ORAL | Status: AC
  Filled 2020-10-25: qty 1

## 2020-10-25 MED ORDER — LACTATED RINGERS IV SOLN: INTRAVENOUS | Status: DC

## 2020-10-25 MED ORDER — KETOROLAC TROMETHAMINE 15 MG/ML IJ SOLN
INTRAMUSCULAR | Status: AC
  Filled 2020-10-25: qty 1

## 2020-10-25 MED ORDER — METHOCARBAMOL 500 MG PO TABS
500.0000 mg | ORAL_TABLET | Freq: Four times a day (QID) | ORAL | Status: DC | PRN
  Administered 2020-10-25 – 2020-10-26 (×2): 500 mg via ORAL
  Filled 2020-10-25: qty 1

## 2020-10-25 MED ORDER — SODIUM CHLORIDE 0.9 % IV SOLN
INTRAVENOUS | Status: AC
  Filled 2020-10-25: qty 2

## 2020-10-25 MED ORDER — POLYETHYLENE GLYCOL 3350 17 G PO PACK: 17.0000 g | PACK | Freq: Every day | ORAL | Status: DC | PRN

## 2020-10-25 MED ORDER — ROPIVACAINE HCL 5 MG/ML IJ SOLN
INTRAMUSCULAR | Status: DC | PRN
  Administered 2020-10-25: 20 mL via PERINEURAL

## 2020-10-25 MED ORDER — ONDANSETRON HCL 4 MG/2ML IJ SOLN: 4.0000 mg | Freq: Four times a day (QID) | INTRAMUSCULAR | Status: DC | PRN

## 2020-10-25 MED ORDER — METOCLOPRAMIDE HCL 5 MG PO TABS: 5.0000 mg | ORAL_TABLET | Freq: Three times a day (TID) | ORAL | Status: DC | PRN

## 2020-10-25 MED ORDER — 0.9 % SODIUM CHLORIDE (POUR BTL) OPTIME
TOPICAL | Status: DC | PRN
  Administered 2020-10-25: 1000 mL

## 2020-10-25 MED ORDER — MIDAZOLAM HCL 5 MG/5ML IJ SOLN
INTRAMUSCULAR | Status: DC | PRN
  Administered 2020-10-25: 2 mg via INTRAVENOUS

## 2020-10-25 MED ORDER — DEXAMETHASONE SODIUM PHOSPHATE 10 MG/ML IJ SOLN
8.0000 mg | Freq: Once | INTRAMUSCULAR | Status: AC
  Administered 2020-10-25: 8 mg via INTRAVENOUS
  Filled 2020-10-25: qty 1

## 2020-10-25 MED ORDER — LIDOCAINE 2% (20 MG/ML) 5 ML SYRINGE
INTRAMUSCULAR | Status: DC | PRN
  Administered 2020-10-25: 20 mg via INTRAVENOUS

## 2020-10-25 MED ORDER — TRANEXAMIC ACID-NACL 1000-0.7 MG/100ML-% IV SOLN
INTRAVENOUS | Status: AC
  Filled 2020-10-25: qty 100

## 2020-10-25 MED ORDER — METHOCARBAMOL 500 MG PO TABS
ORAL_TABLET | ORAL | Status: AC
  Filled 2020-10-25: qty 1

## 2020-10-25 MED ORDER — MENTHOL 3 MG MT LOZG: 1.0000 | LOZENGE | OROMUCOSAL | Status: DC | PRN

## 2020-10-25 MED ORDER — BUPIVACAINE-EPINEPHRINE (PF) 0.25% -1:200000 IJ SOLN
INTRAMUSCULAR | Status: DC | PRN
  Administered 2020-10-25: 30 mL

## 2020-10-25 MED ORDER — ONDANSETRON HCL 4 MG/2ML IJ SOLN
INTRAMUSCULAR | Status: AC
  Filled 2020-10-25: qty 2

## 2020-10-25 MED ORDER — DEXAMETHASONE SODIUM PHOSPHATE 10 MG/ML IJ SOLN
10.0000 mg | Freq: Once | INTRAMUSCULAR | Status: AC
  Administered 2020-10-26: 10 mg via INTRAVENOUS
  Filled 2020-10-25: qty 1

## 2020-10-25 MED ORDER — SODIUM CHLORIDE 0.9 % IR SOLN
Status: DC | PRN
  Administered 2020-10-25: 1000 mL

## 2020-10-25 MED ORDER — CALCIUM CARBONATE ANTACID 500 MG PO CHEW: 1.0000 | CHEWABLE_TABLET | Freq: Three times a day (TID) | ORAL | Status: DC | PRN

## 2020-10-25 MED ORDER — DEXAMETHASONE SODIUM PHOSPHATE 10 MG/ML IJ SOLN
INTRAMUSCULAR | Status: DC | PRN
  Administered 2020-10-25: 5 mg

## 2020-10-25 MED ORDER — ACETAMINOPHEN 500 MG PO TABS
ORAL_TABLET | ORAL | Status: AC
  Filled 2020-10-25: qty 2

## 2020-10-25 MED ORDER — METOCLOPRAMIDE HCL 5 MG/ML IJ SOLN: 5.0000 mg | Freq: Three times a day (TID) | INTRAMUSCULAR | Status: DC | PRN

## 2020-10-25 MED ORDER — HYDROMORPHONE HCL 1 MG/ML IJ SOLN
0.2500 mg | INTRAMUSCULAR | Status: DC | PRN
  Administered 2020-10-25 (×2): 0.5 mg via INTRAVENOUS

## 2020-10-25 MED ORDER — PROPOFOL 500 MG/50ML IV EMUL
INTRAVENOUS | Status: DC | PRN
  Administered 2020-10-25: 100 ug/kg/min via INTRAVENOUS

## 2020-10-25 MED ORDER — METHOCARBAMOL 500 MG IVPB - SIMPLE MED
500.0000 mg | Freq: Four times a day (QID) | INTRAVENOUS | Status: DC | PRN
  Filled 2020-10-25: qty 50

## 2020-10-25 MED ORDER — ATORVASTATIN CALCIUM 20 MG PO TABS
20.0000 mg | ORAL_TABLET | Freq: Every day | ORAL | Status: DC
  Administered 2020-10-25 – 2020-10-26 (×2): 20 mg via ORAL
  Filled 2020-10-25 (×2): qty 1

## 2020-10-25 MED ORDER — DIPHENHYDRAMINE HCL 12.5 MG/5ML PO ELIX: 12.5000 mg | ORAL_SOLUTION | ORAL | Status: DC | PRN

## 2020-10-25 MED ORDER — FERROUS SULFATE 325 (65 FE) MG PO TABS
325.0000 mg | ORAL_TABLET | Freq: Three times a day (TID) | ORAL | Status: DC
  Administered 2020-10-26: 325 mg via ORAL
  Filled 2020-10-25: qty 1

## 2020-10-25 MED ORDER — ONDANSETRON HCL 4 MG/2ML IJ SOLN
INTRAMUSCULAR | Status: AC
  Administered 2020-10-25: 4 mg via INTRAVENOUS
  Filled 2020-10-25: qty 2

## 2020-10-25 SURGICAL SUPPLY — 51 items
ADH SKN CLS APL DERMABOND .7 (GAUZE/BANDAGES/DRESSINGS) ×1
ATTUNE MED ANAT PAT 38 KNEE (Knees) ×1 IMPLANT
ATTUNE PSFEM LTSZ6 NARCEM KNEE (Femur) ×1 IMPLANT
ATTUNE PSRP INSR SZ6 7 KNEE (Insert) ×1 IMPLANT
BAG COUNTER SPONGE SURGICOUNT (BAG) ×1 IMPLANT
BAG SPEC THK2 15X12 ZIP CLS (MISCELLANEOUS)
BAG SPNG CNTER NS LX DISP (BAG) ×1
BAG ZIPLOCK 12X15 (MISCELLANEOUS) ×1 IMPLANT
BASE TIBIA ATTUNE KNEE SYS SZ6 (Knees) IMPLANT
BLADE SAW SGTL 11.0X1.19X90.0M (BLADE) IMPLANT
BLADE SAW SGTL 13.0X1.19X90.0M (BLADE) ×2 IMPLANT
BNDG ELASTIC 6X5.8 VLCR STR LF (GAUZE/BANDAGES/DRESSINGS) ×2 IMPLANT
BOWL SMART MIX CTS (DISPOSABLE) ×2 IMPLANT
BSPLAT TIB 6 CMNT ROT PLAT STR (Knees) ×1 IMPLANT
CEMENT HV SMART SET (Cement) ×2 IMPLANT
COVER SURGICAL LIGHT HANDLE (MISCELLANEOUS) ×2 IMPLANT
CUFF TOURN SGL QUICK 34 (TOURNIQUET CUFF) ×2
CUFF TRNQT CYL 34X4.125X (TOURNIQUET CUFF) ×1 IMPLANT
DECANTER SPIKE VIAL GLASS SM (MISCELLANEOUS) ×5 IMPLANT
DERMABOND ADVANCED (GAUZE/BANDAGES/DRESSINGS) ×1
DERMABOND ADVANCED .7 DNX12 (GAUZE/BANDAGES/DRESSINGS) ×1 IMPLANT
DRAPE U-SHAPE 47X51 STRL (DRAPES) ×2 IMPLANT
DRESSING AQUACEL AG SP 3.5X10 (GAUZE/BANDAGES/DRESSINGS) IMPLANT
DRSG AQUACEL AG ADV 3.5X10 (GAUZE/BANDAGES/DRESSINGS) ×2 IMPLANT
DRSG AQUACEL AG SP 3.5X10 (GAUZE/BANDAGES/DRESSINGS) ×2
DRSG TEGADERM 4X4.75 (GAUZE/BANDAGES/DRESSINGS) IMPLANT
DURAPREP 26ML APPLICATOR (WOUND CARE) ×4 IMPLANT
ELECT REM PT RETURN 15FT ADLT (MISCELLANEOUS) ×2 IMPLANT
GLOVE SURG ENC TEXT LTX SZ7 (GLOVE) IMPLANT
GLOVE SURG UNDER POLY LF SZ7.5 (GLOVE) ×6 IMPLANT
GOWN STRL REUS W/TWL LRG LVL3 (GOWN DISPOSABLE) ×2 IMPLANT
HANDPIECE INTERPULSE COAX TIP (DISPOSABLE) ×2
KIT TURNOVER KIT A (KITS) ×2 IMPLANT
MANIFOLD NEPTUNE II (INSTRUMENTS) ×2 IMPLANT
NS IRRIG 1000ML POUR BTL (IV SOLUTION) ×4 IMPLANT
PACK TOTAL KNEE CUSTOM (KITS) ×2 IMPLANT
PENCIL SMOKE EVACUATOR (MISCELLANEOUS) ×1 IMPLANT
PIN DRILL FIX HALF THREAD (BIT) ×1 IMPLANT
PIN FIX SIGMA LCS THRD HI (PIN) ×1 IMPLANT
PROTECTOR NERVE ULNAR (MISCELLANEOUS) ×2 IMPLANT
SET HNDPC FAN SPRY TIP SCT (DISPOSABLE) ×1 IMPLANT
SET PAD KNEE POSITIONER (MISCELLANEOUS) ×2 IMPLANT
SUT MNCRL AB 4-0 PS2 18 (SUTURE) ×2 IMPLANT
SUT STRATAFIX PDS+ 0 24IN (SUTURE) ×2 IMPLANT
SUT VIC AB 1 CT1 36 (SUTURE) ×2 IMPLANT
SUT VIC AB 2-0 CT1 27 (SUTURE) ×6
SUT VIC AB 2-0 CT1 TAPERPNT 27 (SUTURE) ×3 IMPLANT
TIBIA ATTUNE KNEE SYS BASE SZ6 (Knees) ×2 IMPLANT
TRAY FOLEY MTR SLVR 16FR STAT (SET/KITS/TRAYS/PACK) ×1 IMPLANT
WATER STERILE IRR 1000ML POUR (IV SOLUTION) ×2 IMPLANT
WRAP KNEE MAXI GEL POST OP (GAUZE/BANDAGES/DRESSINGS) ×2 IMPLANT

## 2020-10-25 NOTE — Anesthesia Procedure Notes (Signed)
Spinal  Patient location during procedure: OR Start time: 10/25/2020 7:30 AM End time: 10/25/2020 7:35 AM Reason for block: surgical anesthesia Staffing Performed: anesthesiologist  Anesthesiologist: Elmer Picker, MD Preanesthetic Checklist Completed: patient identified, IV checked, risks and benefits discussed, surgical consent, monitors and equipment checked, pre-op evaluation and timeout performed Spinal Block Patient position: sitting Prep: DuraPrep and site prepped and draped Patient monitoring: cardiac monitor, continuous pulse ox and blood pressure Approach: midline Location: L3-4 Injection technique: single-shot Needle Needle type: Pencan  Needle gauge: 24 G Needle length: 9 cm Assessment Sensory level: T6 Events: CSF return Additional Notes Functioning IV was confirmed and monitors were applied. Sterile prep and drape, including hand hygiene and sterile gloves were used. The patient was positioned and the spine was prepped. The skin was anesthetized with lidocaine.  Free flow of clear CSF was obtained prior to injecting local anesthetic into the CSF.  The spinal needle aspirated freely following injection.  The needle was carefully withdrawn.  The patient tolerated the procedure well.

## 2020-10-25 NOTE — Anesthesia Postprocedure Evaluation (Signed)
Anesthesia Post Note  Patient: Melvin Sweeney  Procedure(s) Performed: CONVERSION TO TOTAL KNEE (Left: Knee)     Patient location during evaluation: PACU Anesthesia Type: Regional and Spinal Level of consciousness: oriented and awake and alert Pain management: pain level controlled Vital Signs Assessment: post-procedure vital signs reviewed and stable Respiratory status: spontaneous breathing, respiratory function stable and patient connected to nasal cannula oxygen Cardiovascular status: blood pressure returned to baseline and stable Postop Assessment: no headache, no backache and no apparent nausea or vomiting Anesthetic complications: no   No notable events documented.  Last Vitals:  Vitals:   10/25/20 1015 10/25/20 1115  BP: 128/89 (!) 145/75  Pulse: 67 77  Resp: 12 14  Temp: 36.7 C   SpO2: 100% 100%    Last Pain:  Vitals:   10/25/20 1130  TempSrc:   PainSc: 7                  Jeneal Vogl L Lyncoln Maskell

## 2020-10-25 NOTE — Transfer of Care (Signed)
Immediate Anesthesia Transfer of Care Note  Patient: Melvin Sweeney  Procedure(s) Performed: CONVERSION TO TOTAL KNEE (Left: Knee)  Patient Location: PACU  Anesthesia Type:Spinal and MAC combined with regional for post-op pain  Level of Consciousness: sedated and responds to stimulation  Airway & Oxygen Therapy: Patient Spontanous Breathing and Patient connected to face mask oxygen  Post-op Assessment: Report given to RN and Post -op Vital signs reviewed and stable  Post vital signs: Reviewed and stable  Last Vitals:  Vitals Value Taken Time  BP 107/55 10/25/20 0920  Temp    Pulse 63 10/25/20 0922  Resp 27 10/25/20 0922  SpO2 100 % 10/25/20 0922  Vitals shown include unvalidated device data.  Last Pain:  Vitals:   10/25/20 0707  TempSrc:   PainSc: 0-No pain      Patients Stated Pain Goal: 3 (03/88/82 8003)  Complications: No notable events documented.

## 2020-10-25 NOTE — Plan of Care (Signed)
  Problem: Education: Goal: Knowledge of General Education information will improve Description: Including pain rating scale, medication(s)/side effects and non-pharmacologic comfort measures Outcome: Progressing   Problem: Activity: Goal: Risk for activity intolerance will decrease Outcome: Progressing   Problem: Pain Managment: Goal: General experience of comfort will improve Outcome: Progressing   

## 2020-10-25 NOTE — Anesthesia Procedure Notes (Signed)
Anesthesia Regional Block: Adductor canal block   Pre-Anesthetic Checklist: , timeout performed,  Correct Patient, Correct Site, Correct Laterality,  Correct Procedure, Correct Position, site marked,  Risks and benefits discussed,  Surgical consent,  Pre-op evaluation,  At surgeon's request and post-op pain management  Laterality: Left  Prep: Maximum Sterile Barrier Precautions used, chloraprep       Needles:  Injection technique: Single-shot  Needle Type: Echogenic Stimulator Needle     Needle Length: 9cm  Needle Gauge: 22     Additional Needles:   Procedures:,,,, ultrasound used (permanent image in chart),,    Narrative:  Start time: 10/25/2020 7:19 AM End time: 10/25/2020 7:22 AM Injection made incrementally with aspirations every 5 mL.  Performed by: Personally  Anesthesiologist: Elmer Picker, MD  Additional Notes: Monitors applied. No increased pain on injection. No increased resistance to injection. Injection made in 5cc increments. Good needle visualization. Patient tolerated procedure well.

## 2020-10-25 NOTE — Discharge Instructions (Signed)

## 2020-10-25 NOTE — Interval H&P Note (Signed)
History and Physical Interval Note:  10/25/2020 7:21 AM  Melvin Sweeney  has presented today for surgery, with the diagnosis of Failed Left uni compartemental arthroplasty.  The various methods of treatment have been discussed with the patient and family. After consideration of risks, benefits and other options for treatment, the patient has consented to  Procedure(s): CONVERSION TO TOTAL KNEE (Left) as a surgical intervention.  The patient's history has been reviewed, patient examined, no change in status, stable for surgery.  I have reviewed the patient's chart and labs.  Questions were answered to the patient's satisfaction.     Shelda Pal

## 2020-10-25 NOTE — Evaluation (Signed)
Physical Therapy Evaluation Patient Details Name: Melvin Sweeney MRN: [id number] DOB: 1960/08/21 Today's Date: 10/25/2020   History of Present Illness  Pt s/p L TKR converted from UKR and with hx of R TKR with multiple revisions.  Clinical Impression  Pt s/p L TKR and presents with decreased L LE strength/ROM and post op pain limiting functional mobility.  Pt should progress to dc home with family assist and reports first OP PT 10/29/20.    Follow Up Recommendations Follow surgeon's recommendation for DC plan and follow-up therapies    Equipment Recommendations  Rolling walker with 5" wheels;3in1 (PT)    Recommendations for Other Services       Precautions / Restrictions Precautions Precautions: Fall;Knee Restrictions Weight Bearing Restrictions: No Other Position/Activity Restrictions: WBAT      Mobility  Bed Mobility Overal bed mobility: Needs Assistance Bed Mobility: Supine to Sit     Supine to sit: Min assist     General bed mobility comments: cues for sequence with min assist to manage L LE    Transfers Overall transfer level: Needs assistance Equipment used: Rolling walker (2 wheeled) Transfers: Sit to/from Stand Sit to Stand: Min assist;Min guard         General transfer comment: steady assist with cues for LE management and use of UEs to self assist  Ambulation/Gait Ambulation/Gait assistance: Min guard Gait Distance (Feet): 110 Feet Assistive device: Rolling walker (2 wheeled) Gait Pattern/deviations: Step-to pattern;Decreased step length - right;Decreased step length - left;Shuffle;Trunk flexed Gait velocity: mod pace   General Gait Details: cues for sequence, posture and position from [employer]            Wheelchair Mobility    Modified Rankin (Stroke Patients Only)       Balance Overall balance assessment: Needs assistance Sitting-balance support: No upper extremity supported;Feet supported Sitting balance-Leahy Scale: Good      Standing balance support: Bilateral upper extremity supported Standing balance-Leahy Scale: Poor                               Pertinent Vitals/Pain Pain Assessment: 0-10 Pain Score: 8  Pain Location: L knee Pain Descriptors / Indicators: Aching;Sore;Throbbing Pain Intervention(s): Limited activity within patient's tolerance;Monitored during session;Premedicated before session;Patient requesting pain meds-RN notified;Ice applied    Home Living Family/patient expects to be discharged to:: Private residence Living Arrangements: Spouse/significant other;Children Available Help at Discharge: Family Type of Home: House Home Access: Level entry     Home Layout: One level Home Equipment: Grab bars - toilet;Grab bars - tub/shower;Crutches      Prior Function Level of Independence: Independent               Hand Dominance   Dominant Hand: Left    Extremity/Trunk Assessment   Upper Extremity Assessment Upper Extremity Assessment: Overall WFL for tasks assessed    Lower Extremity Assessment Lower Extremity Assessment: LLE deficits/detail;RLE deficits/detail RLE Deficits / Details: Limited flexion since multiple prior surgeries    Cervical / Trunk Assessment Cervical / Trunk Assessment: Normal  Communication   Communication: No difficulties  Cognition Arousal/Alertness: Awake/alert Behavior During Therapy: WFL for tasks assessed/performed Overall Cognitive Status: Within Functional Limits for tasks assessed                                        General Comments  Exercises Total Joint Exercises Ankle Circles/Pumps: AROM;Both;15 reps;Supine   Assessment/Plan    PT Assessment Patient needs continued PT services  PT Problem List Decreased strength;Decreased range of motion;Decreased activity tolerance;Decreased balance;Decreased mobility;Decreased knowledge of use of DME;Pain       PT Treatment Interventions DME  instruction;Gait training;Stair training;Functional mobility training;Therapeutic activities;Therapeutic exercise;Balance training;Patient/family education    PT Goals (Current goals can be found in the Care Plan section)  Acute Rehab PT Goals Patient Stated Goal: Regain IND and never have another knee surgery PT Goal Formulation: With patient Time For Goal Achievement: 10/31/20 Potential to Achieve Goals: Good    Frequency 7X/week   Barriers to discharge        Co-evaluation               AM-PAC PT "6 Clicks" Mobility  Outcome Measure Help needed turning from your back to your side while in a flat bed without using bedrails?: A Little Help needed moving from lying on your back to sitting on the side of a flat bed without using bedrails?: A Little Help needed moving to and from a bed to a chair (including a wheelchair)?: A Little Help needed standing up from a chair using your arms (e.g., wheelchair or bedside chair)?: A Little Help needed to walk in hospital room?: A Little Help needed climbing 3-5 steps with a railing? : A Little 6 Click Score: 18    End of Session Equipment Utilized During Treatment: Gait belt Activity Tolerance: Patient tolerated treatment well;Patient limited by pain Patient left: in chair;with call bell/phone within reach;with chair alarm set;with family/visitor present Nurse Communication: Mobility status PT Visit Diagnosis: Unsteadiness on feet (R26.81);Difficulty in walking, not elsewhere classified (R26.2)    Time: 9379-0240 PT Time Calculation (min) (ACUTE ONLY): 22 min   Charges:   PT Evaluation $PT Eval Low Complexity: 1 Low          Mauro Kaufmann PT Acute Rehabilitation Services Pager [phone] Office [phone]   Fabian Walder 10/25/2020, 3:45 PM

## 2020-10-25 NOTE — Op Note (Signed)
NAME:  Melvin Sweeney                      MEDICAL RECORD NO.:  [id number]                             FACILITY:  [facility]      PHYSICIAN:  Madlyn Frankel. Charlann Boxer, M.D.  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 Moderate effusion without signs of infection.     PROCEDURE:  Conversion of failed left partial knee replacement to left total knee replacement      COMPONENTS USED:  DePuy Attune primary rotating platform posterior stabilized knee   system, a size 6 narrow femur, 6 tibia, size 7 mm PS AOX insert, and 38 anatomic patellar   button.      SURGEON:  Madlyn Frankel. Charlann Boxer, M.D.      ASSISTANT:  Rosalene Billings, PA-C.      ANESTHESIA:  Regional and Spinal.      SPECIMENS:  None.      COMPLICATION:  None.      DRAINS:  None.  EBL: <100 cc      TOURNIQUET TIME:   Total Tourniquet Time Documented: Thigh (Left) - 38 minutes Total: Thigh (Left) - 38 minutes  .      The patient was stable to the recovery room.      INDICATION FOR PROCEDURE:  Melvin Sweeney is a 60 y.o. male patient of   mine.  The patient had been seen and evaluated for a painful left knee after partial knee replacement.  Given duration of symptoms as well as history of prior intervention on his right knee he elected to have it converted to a total knee.  His left knee was at this point affecting his quality of life.  The patient had   radiographic changes within the patellofemoral compartment  with stable appearing partial knee components.  Based on the radiographic changes and failed conservative measures, the patient   decided to proceed with definitive treatment, conversion to a left total knee replacement.  Risks of infection, DVT, component failure, need for revision surgery, neurovascular injury were reviewed in the office setting.  The postop course was reviewed stressing the efforts to maximize post-operative satisfaction and function.  Consent was obtained for benefit of pain   relief.      PROCEDURE IN DETAIL:  The patient was brought to the operative theater.   Once adequate anesthesia, preoperative antibiotics, 2 gm of Ancef,1 gm of Tranexamic Acid, and 10 mg of Decadron administered, the patient was positioned supine with a left thigh tourniquet placed.  The  left lower extremity was prepped and draped in sterile fashion.  A  time-   out was performed identifying the patient, planned procedure, and the appropriate extremity.      The left lower extremity was placed in the [facility] leg holder.  The leg was   exsanguinated, tourniquet elevated to 225 mmHg.  A midline incision was   made followed by median parapatellar arthrotomy.  Following initial   exposure, attention was first directed to the patella.  Precut   measurement was noted to be 26 mm.  I resected down to 14 mm and used a   38 anatomic patellar button to restore patellar height as well as cover the cut surface.      The lug holes were drilled and a metal shim was placed to protect the   patella from retractors and saw blade during the procedure.      At this point, attention was now directed to the femur.  The femoral   canal was opened with a drill, irrigated to try to prevent fat emboli.  An   intramedullary rod was passed at 5 degrees valgus, 9 mm of bone was   resected off the distal femur.  During this process the medial femoral component was removed without bone loss.  Old cement was removed.  Following  this resection, the tibia was   subluxated anteriorly.  I placed the cutting surface of the extramedullary guide just beneath the medial tibial tray.  The tibial cut was mad and at the same time the tibial tray was removed without additional bone loss.  Old cement was removed at this time as well.  We confirmed the gap would be   stable medially and laterally with a size 6 spacer block as well as confirmed that the tibial cut was perpendicular in the coronal plane, checking with an alignment rod.      Once this was done, I sized the femur to be a size 6 in the anterior-   posterior dimension, chose a narrow component based on medial and   lateral dimension.  The size 6 rotation block was then pinned in   position anterior referenced using the C-clamp to set rotation.  The   anterior, posterior, and  chamfer cuts were made without difficulty nor   notching making certain that I was along the anterior cortex to help   with flexion gap stability.      The final box cut was made off the lateral aspect of distal femur.      At this point, the tibia was sized to be a size 6.  The size 6 tray was   then pinned in position through the medial third of the tubercle,   drilled, and keel punched.  Trial reduction was now carried with a 6 femur,  6 tibia, a size 7 mm PS insert, and the 38 anatomic patella botton.  The knee was brought to full extension with good flexion stability with the patella   tracking through the trochlea without application of pressure.  Given   all these findings the trial components removed.  Final components were   opened and cement was mixed.  I used a drill pin to drill sclerotic bone on the medial aspect of the femur and tibia.  The knee was irrigated with normal saline solution and pulse lavage.  The synovial lining was   then injected with 30 cc of 0.25% Marcaine with epinephrine, 1 cc of Toradol and 30 cc of NS for a total of 61 cc.  Final implants were then cemented  onto cleaned and dried cut surfaces of bone with the knee brought to extension with a size 7 mm PS trial insert.      Once the cement had fully cured, excess cement was removed   throughout the knee.  I confirmed that I was satisfied with the range of   motion and stability, and the final size 7 mm PS AOX insert was chosen.  It was   placed into the knee.      The tourniquet had been let down at 38 minutes.  No significant   hemostasis was required.  The extensor mechanism was then reapproximated using #1 Vicryl and #1 Stratafix sutures with the knee   in flexion.  The   remaining wound was closed with 2-0 Vicryl and running 4-0 Monocryl.   The knee was cleaned, dried, dressed sterilely using Dermabond and   Aquacel dressing.  The patient was then   brought to recovery room in stable condition, tolerating the procedure   well.   Please note that Physician Assistant, Rosalene Billings, PA-C was present for the entirety of the case, and was utilized for pre-operative positioning, peri-operative retractor management, general facilitation of the procedure and for primary wound closure at the end of the case.              Madlyn Frankel Charlann Boxer, M.D.    10/25/2020 8:54 AM

## 2020-10-26 ENCOUNTER — Encounter (HOSPITAL_COMMUNITY): Payer: Self-pay | Admitting: Orthopedic Surgery

## 2020-10-26 LAB — CBC
HCT: 41.4 % (ref 39.0–52.0)
Hemoglobin: 13.3 g/dL (ref 13.0–17.0)
MCH: 28.9 pg (ref 26.0–34.0)
MCHC: 32.1 g/dL (ref 30.0–36.0)
MCV: 90 fL (ref 80.0–100.0)
Platelets: 171 10*3/uL (ref 150–400)
RBC: 4.6 MIL/uL (ref 4.22–5.81)
RDW: 13.4 % (ref 11.5–15.5)
WBC: 17.1 10*3/uL — ABNORMAL HIGH (ref 4.0–10.5)
nRBC: 0 % (ref 0.0–0.2)

## 2020-10-26 LAB — BASIC METABOLIC PANEL
Anion gap: 7 (ref 5–15)
BUN: 17 mg/dL (ref 6–20)
CO2: 25 mmol/L (ref 22–32)
Calcium: 8.6 mg/dL — ABNORMAL LOW (ref 8.9–10.3)
Chloride: 105 mmol/L (ref 98–111)
Creatinine, Ser: 0.92 mg/dL (ref 0.61–1.24)
GFR, Estimated: >60 mL/min (ref >60)
Glucose, Bld: 170 mg/dL — ABNORMAL HIGH (ref 70–99)
Potassium: 4.4 mmol/L (ref 3.5–5.1)
Sodium: 137 mmol/L (ref 135–145)

## 2020-10-26 MED ORDER — ASPIRIN 81 MG PO CHEW: 81.0000 mg | CHEWABLE_TABLET | Freq: Two times a day (BID) | ORAL | 0 refills | Status: AC

## 2020-10-26 MED ORDER — METHOCARBAMOL 500 MG PO TABS: 500.0000 mg | ORAL_TABLET | Freq: Four times a day (QID) | ORAL | 0 refills | Status: AC | PRN

## 2020-10-26 MED ORDER — OXYCODONE HCL 5 MG PO TABS: 5.0000 mg | ORAL_TABLET | ORAL | 0 refills | Status: AC | PRN

## 2020-10-26 NOTE — Progress Notes (Signed)
   Subjective: 1 Day Post-Op Procedure(s) (LRB): CONVERSION TO TOTAL KNEE (Left) Patient reports pain as mild.   Patient seen in rounds by Dr. Charlann Boxer. Patient is well, and has had no acute complaints or problems. No acute events overnight. Foley catheter removed. Patient ambulated 110 feet with PT.  We will continue therapy today.   Objective: Vital signs in last 24 hours: Temp:  [97.7 F (36.5 C)-99.2 F (37.3 C)] 97.7 F (36.5 C) (07/29 0426) Pulse Rate:  [59-87] 62 (07/29 0426) Resp:  [10-20] 16 (07/29 0426) BP: (107-172)/(55-95) 140/78 (07/29 0426) SpO2:  [96 %-100 %] 98 % (07/29 0426)  Intake/Output from previous day:  Intake/Output Summary (Last 24 hours) at 10/26/2020 0816 Last data filed at 10/26/2020 0200 Gross per 24 hour  Intake 2082.5 ml  Output 625 ml  Net 1457.5 ml     Intake/Output this shift: No intake/output data recorded.  Labs: Recent Labs    10/26/20 0316  HGB 13.3   Recent Labs    10/26/20 0316  WBC 17.1*  RBC 4.60  HCT 41.4  PLT 171   Recent Labs    10/26/20 0316  NA 137  K 4.4  CL 105  CO2 25  BUN 17  CREATININE 0.92  GLUCOSE 170*  CALCIUM 8.6*   No results for input(s): LABPT, INR in the last 72 hours.  Exam: General - Patient is Alert and Oriented Extremity - Neurologically intact Sensation intact distally Intact pulses distally Dorsiflexion/Plantar flexion intact Dressing - dressing C/D/I Motor Function - intact, moving foot and toes well on exam.   Past Medical History:  Diagnosis Date   Arthritis    Complication of anesthesia    'scratched throat and spitting blood after RCR surgery'   Hypertension    Neuromuscular disorder (HCC)    Pre-diabetes     Assessment/Plan: 1 Day Post-Op Procedure(s) (LRB): CONVERSION TO TOTAL KNEE (Left) Active Problems:   S/P left revision of total knee   S/P revision of total knee, left  Estimated body mass index is 27.72 kg/m as calculated from the following:   Height as of  this encounter: 5\' 7"  (1.702 m).   Weight as of this encounter: 80.3 kg. Advance diet Up with therapy D/C IV fluids   Patient's anticipated LOS is less than 2 midnights, meeting these requirements: - Younger than 59 - Lives within 1 hour of care - Has a competent adult at home to recover with post-op recover - NO history of  - Chronic pain requiring opiods  - Diabetes  - Coronary Artery Disease  - Heart failure  - Heart attack  - Stroke  - DVT/VTE  - Cardiac arrhythmia  - Respiratory Failure/COPD  - Renal failure  - Anemia  - Advanced Liver disease  DVT Prophylaxis - Aspirin Weight bearing as tolerated.  Plan is to go Home after hospital stay. Plan for discharge today following 1-2 sessions of PT as long as they are meeting their goals. Patient is scheduled for OPPT. Follow up in the office in 2 weeks.   Patient is in chronic pain management with 78, PA-C. She recommended that he take Suboxone 8 mg BID to allow our acute pain medications to be effective. We will send him with additional Oxycodone for post-operative pain.   Marianna Payment, PA-C Orthopedic Surgery [phone] 10/26/2020, 8:16 AM

## 2020-10-26 NOTE — TOC Transition Note (Signed)
Transition of Care [facility]) - CM/SW Discharge Note   Patient Details  Name: Melvin Sweeney MRN: [id number] Date of Birth: [birth year]  Transition of Care [facility]) CM/SW Contact:  Lennart Pall, LCSW Phone Number: 10/26/2020, 11:43 AM   Clinical Narrative:     Met with pt to review dc plans.  Pt confirms he already has a rw at home so only received 3n1 commode via [place].  Plan for OPPT at [facility].  No further TOC needs.  Final next level of care: OP Rehab Barriers to Discharge: No Barriers Identified   Patient Goals and CMS Choice Patient states their goals for this hospitalization and ongoing recovery are:: return home      Discharge Placement                       Discharge Plan and Services                DME Arranged: 3-N-1 DME Agency: [employer] Date DME Agency Contacted:  (ordered via MD office)                Social Determinants of Health (SDOH) Interventions     Readmission Risk Interventions No flowsheet data found.

## 2020-10-26 NOTE — Progress Notes (Signed)
Provided discharge education/instructions, all questions and concerns addressed, Pt not in distress, BSC delivered to room. Pt to discharge home with belongings accompanied by family.

## 2020-10-26 NOTE — Plan of Care (Signed)

## 2020-10-26 NOTE — Progress Notes (Signed)
Physical Therapy Treatment Patient Details Name: Melvin Sweeney MRN: [id number] DOB: 1960/06/12 Today's Date: 10/26/2020    History of Present Illness Pt s/p L TKR converted from UKR and with hx of R TKR with multiple revisions.    PT Comments    POD # 1 Pt OOB in recliner.  Assisted with amb in hallway, Then returned to room to perform some TE's following HEP handout.  Instructed on proper tech, freq as well as use of ICE.   Addressed all mobility questions, discussed appropriate activity, educated on use of ICE.  Pt ready for D/C to home.   Follow Up Recommendations  Follow surgeon's recommendation for DC plan and follow-up therapies;Outpatient PT     Equipment Recommendations  Rolling walker with 5" wheels;3in1 (PT) (insurance will not cover walker/has one a home)    Recommendations for Other Services       Precautions / Restrictions Precautions Precautions: Fall;Knee Precaution Comments: instructed no pillow under knee Restrictions Weight Bearing Restrictions: No LLE Weight Bearing: Weight bearing as tolerated    Mobility  Bed Mobility               General bed mobility comments: OOB in recliner    Transfers Overall transfer level: Needs assistance Equipment used: Rolling walker (2 wheeled) Transfers: Sit to/from Stand Sit to Stand: Supervision         General transfer comment: one VC safety with turns  Ambulation/Gait Ambulation/Gait assistance: Supervision Gait Distance (Feet): 185 Feet Assistive device: Rolling walker (2 wheeled) Gait Pattern/deviations: Step-to pattern;Decreased step length - right;Decreased step length - left;Shuffle;Trunk flexed Gait velocity: mod pace   General Gait Details: tolerated a functional distance and present wit good safety cognition   Stairs Stairs:  (no stairs to enter home)           Wheelchair Mobility    Modified Rankin (Stroke Patients Only)       Balance                                             Cognition Arousal/Alertness: Awake/alert Behavior During Therapy: WFL for tasks assessed/performed Overall Cognitive Status: Within Functional Limits for tasks assessed                                 General Comments: AxO x 3 very motivated      Exercises  Total Knee Replacement TE's following HEP handout 10 reps B LE ankle pumps 05 reps towel squeezes 05 reps knee presses 05 reps heel slides  05 reps SAQ's 05 reps SLR's 05 reps ABD Educated on use of gait belt to assist with TE's Followed by ICE     General Comments        Pertinent Vitals/Pain Pain Assessment: 0-10 Pain Score: 5  Pain Location: L knee Pain Descriptors / Indicators: Aching;Sore;Throbbing;Operative site guarding Pain Intervention(s): Monitored during session;Premedicated before session;Repositioned;Ice applied    Home Living                      Prior Function            PT Goals (current goals can now be found in the care plan section) Progress towards PT goals: Progressing toward goals    Frequency    7X/week  PT Plan Current plan remains appropriate    Co-evaluation              AM-PAC PT "6 Clicks" Mobility   Outcome Measure  Help needed turning from your back to your side while in a flat bed without using bedrails?: None Help needed moving from lying on your back to sitting on the side of a flat bed without using bedrails?: None Help needed moving to and from a bed to a chair (including a wheelchair)?: A Little Help needed standing up from a chair using your arms (e.g., wheelchair or bedside chair)?: A Little Help needed to walk in hospital room?: A Little Help needed climbing 3-5 steps with a railing? : A Little 6 Click Score: 20    End of Session Equipment Utilized During Treatment: Gait belt Activity Tolerance: Patient tolerated treatment well Patient left: in chair;with call bell/phone within reach;with chair  alarm set;with family/visitor present Nurse Communication: Mobility status (pt ready for D/C one session) PT Visit Diagnosis: Unsteadiness on feet (R26.81);Difficulty in walking, not elsewhere classified (R26.2)     Time: 9179-1505 PT Time Calculation (min) (ACUTE ONLY): 25 min  Charges:  $Gait Training: 8-22 mins $Therapeutic Exercise: 8-22 mins                     {Anuhea Gassner  PTA Acute  Rehabilitation Services Pager      [phone] Office      [phone]

## 2020-11-01 NOTE — Discharge Summary (Signed)
Physician Discharge Summary   Patient ID: Melvin Sweeney MRN: [id number] DOB/AGE: 07-Jun-1960 60 y.o.  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  Admission Diagnoses:  Past Medical History:  Diagnosis Date   Arthritis    Complication of anesthesia    'scratched throat and spitting blood after RCR surgery'   Hypertension    Neuromuscular disorder (HCC)    Pre-diabetes    Discharge Diagnoses:   Active Problems:   S/P left revision of total knee   S/P revision of total knee, left  Estimated body mass index is 27.72 kg/m as calculated from the following:   Height as of this encounter:  (1.702 m).   Weight as of this encounter: 80.3 kg.  Procedure:  Procedure(s) (LRB): CONVERSION TO TOTAL KNEE (Left)   Consults: None  HPI:  Melvin Sweeney is a 60 y.o. male patient of  mine.  The patient had been seen and evaluated for a painful left knee after partial knee replacement.  Given duration of symptoms as well as history of prior intervention on his right knee he elected to have it converted to a total knee.  His left knee was at this point affecting his quality of life.  The patient had  radiographic changes within the patellofemoral compartment with stable appearing partial knee components.  Based on the radiographic changes and failed conservative measures, the patient  decided to proceed with definitive treatment, conversion to a left total knee replacement.  Risks of infection, DVT, component failure, need for revision surgery, neurovascular injury were reviewed in the office setting.  The postop course was reviewed stressing the efforts to maximize post-operative satisfaction and function.  Consent was obtained for benefit of pain  relief.  Laboratory Data: Admission on 10/25/2020, Discharged on 10/26/2020  Component Date Value Ref  Range Status   WBC 10/26/2020 17.1 (A) 4.0 - 10.5 K/uL Final   RBC 10/26/2020 4.60  4.22 - 5.81 MIL/uL Final   Hemoglobin 10/26/2020 13.3  13.0 - 17.0 g/dL Final   HCT 40/98/1191 41.4  39.0 - 52.0 % Final   MCV 10/26/2020 90.0  80.0 - 100.0 fL Final   MCH 10/26/2020 28.9  26.0 - 34.0 pg Final   MCHC 10/26/2020 32.1  30.0 - 36.0 g/dL Final   RDW 47/82/9562 13.4  11.5 - 15.5 % Final   Platelets 10/26/2020 171  150 - 400 K/uL Final   nRBC 10/26/2020 0.0  0.0 - 0.2 % Final   Performed at [facility], [address], [place], [place] [address]   Sodium 10/26/2020 137  135 - 145 mmol/L Final   Potassium 10/26/2020 4.4  3.5 - 5.1 mmol/L Final   Chloride 10/26/2020 105  98 - 111 mmol/L Final   CO2 10/26/2020 25  22 - 32 mmol/L Final   Glucose, Bld 10/26/2020 170 (A) 70 - 99 mg/dL Final   Glucose reference range applies only to samples taken after fasting for at least 8 hours.   BUN 10/26/2020 17  6 - 20 mg/dL Final   Creatinine, Ser 10/26/2020 0.92  0.61 - 1.24 mg/dL Final   Calcium 57/84/6962 8.6 (A) 8.9 - 10.3 mg/dL Final   GFR, Estimated 10/26/2020 >60  >60 mL/min Final   Comment: (NOTE) Calculated using the CKD-EPI Creatinine Equation (2021)    Anion gap 10/26/2020 7  5 - 15 Final   Performed at  [facility], [address], [place], [place] [address]  Hospital Outpatient Visit on 10/23/2020  Component Date Value Ref Range Status   SARS Coronavirus 2 10/23/2020 NEGATIVE  NEGATIVE Final   Comment: (NOTE) SARS-CoV-2 target nucleic acids are NOT DETECTED.  The SARS-CoV-2 RNA is generally detectable in upper and lower respiratory specimens during the acute phase of infection. Negative results do not preclude SARS-CoV-2 infection, do not rule out co-infections with other pathogens, and should not be used as the sole basis for treatment or other patient management decisions. Negative results must be combined with clinical observations, patient  history, and epidemiological information. The expected result is Negative.  Fact Sheet for Patients: HairSlick.no  Fact Sheet for Healthcare Providers: quierodirigir.com  This test is not yet approved or cleared by the [place] FDA and  has been authorized for detection and/or diagnosis of SARS-CoV-2 by FDA under an Emergency Use Authorization (EUA). This EUA will remain  in effect (meaning this test can be used) for the duration of the COVID-19 declaration under Se                          ction 564(b)(1) of the Act, 21 U.S.C. section 360bbb-3(b)(1), unless the authorization is terminated or revoked sooner.  Performed at [facility] [place] [facility], [address] [place], [place] [address]   Hospital Outpatient Visit on 10/17/2020  Component Date Value Ref Range Status   MRSA, PCR 10/17/2020 NEGATIVE  NEGATIVE Final   Staphylococcus aureus 10/17/2020 NEGATIVE  NEGATIVE Final   Comment: (NOTE) The Xpert SA Assay (FDA approved for NASAL specimens in patients 92 years of age and older), is one component of a comprehensive surveillance program. It is not intended to diagnose infection nor to guide or monitor treatment. Performed at [facility], [address], [place], [place] [address]    WBC 10/17/2020 7.2  4.0 - 10.5 K/uL Final   RBC 10/17/2020 5.25  4.22 - 5.81 MIL/uL Final   Hemoglobin 10/17/2020 15.1  13.0 - 17.0 g/dL Final   HCT 63/87/5643 47.0  39.0 - 52.0 % Final   MCV 10/17/2020 89.5  80.0 - 100.0 fL Final   MCH 10/17/2020 28.8  26.0 - 34.0 pg Final   MCHC 10/17/2020 32.1  30.0 - 36.0 g/dL Final   RDW 32/95/1884 13.4  11.5 - 15.5 % Final   Platelets 10/17/2020 149 (A) 150 - 400 K/uL Final   nRBC 10/17/2020 0.0  0.0 - 0.2 % Final   Performed at [facility], [address] [place], [place] [address]   Sodium 10/17/2020 141  135 - 145 mmol/L Final   Potassium 10/17/2020 4.0   3.5 - 5.1 mmol/L Final   Chloride 10/17/2020 105  98 - 111 mmol/L Final   CO2 10/17/2020 27  22 - 32 mmol/L Final   Glucose, Bld 10/17/2020 98  70 - 99 mg/dL Final   Glucose reference range applies only to samples taken after fasting for at least 8 hours.   BUN 10/17/2020 9  6 - 20 mg/dL Final   Creatinine, Ser 10/17/2020 0.84  0.61 - 1.24 mg/dL Final   Calcium 30/16/0109 9.4  8.9 - 10.3 mg/dL Final   Total Protein 32/35/5732 7.5  6.5 - 8.1 g/dL Final   Albumin 20/25/4270 4.4  3.5 - 5.0 g/dL Final   AST 62/37/6283 22  15 - 41 U/L Final   ALT 10/17/2020 19  0 -  44 U/L Final   Alkaline Phosphatase 10/17/2020 76  38 - 126 U/L Final   Total Bilirubin 10/17/2020 0.5  0.3 - 1.2 mg/dL Final   GFR, Estimated 10/17/2020 >60  >60 mL/min Final   Comment: (NOTE) Calculated using the CKD-EPI Creatinine Equation (2021)    Anion gap 10/17/2020 9  5 - 15 Final   Performed at [facility], [address], [place], [place] [address]   Prothrombin Time 10/17/2020 12.5  11.4 - 15.2 seconds Final   INR 10/17/2020 0.9  0.8 - 1.2 Final   Comment: (NOTE) INR goal varies based on device and disease states. Performed at [facility], [address] [place], [place] [address]    aPTT 10/17/2020 29  24 - 36 seconds Final   Performed at [facility], [address], [place], [place] [address]   ABO/RH(D) 10/17/2020 O POS   Final   Antibody Screen 10/17/2020 NEG   Final   Sample Expiration 10/17/2020 10/28/2020,2359   Final   Extend sample reason 10/17/2020    Final                   Value:NO TRANSFUSIONS OR PREGNANCY IN THE PAST 3 MONTHS Performed at [facility], [address] [place], [place] [address]    Hgb A1c MFr Bld 10/17/2020 6.1 (A) 4.8 - 5.6 % Final   Comment: (NOTE) Pre diabetes:          5.7%-6.4%  Diabetes:              >6.4%  Glycemic control for   <7.0% adults with diabetes    Mean Plasma Glucose 10/17/2020  128.37  mg/dL Final   Performed at [facility], [address] [place], [place] [address]     X-Rays:No results found.  EKG: Orders placed or performed during the hospital encounter of 06/29/19   EKG   EKG     Hospital Course: Melvin Sweeney is a 60 y.o. who was admitted to [facility]. They were brought to the operating room on 10/25/2020 and underwent Procedure(s): CONVERSION TO TOTAL KNEE.  Patient tolerated the procedure well and was later transferred to the recovery room and then to the orthopaedic floor for postoperative care. They were given PO and IV analgesics for pain control following their surgery. They were given 24 hours of postoperative antibiotics of  Anti-infectives (From admission, onward)    Start     Dose/Rate Route Frequency Ordered Stop   10/25/20 1600  ceFAZolin (ANCEF) 2 g in sodium chloride 0.9 % 100 mL IVPB        2 g 200 mL/hr over 30 Minutes Intravenous Every 6 hours 10/25/20 1018 10/25/20 2334   10/25/20 0636  sodium chloride 0.9 % with ceFAZolin (ANCEF) ADS Med  Status:  Discontinued       Note to Pharmacy: Montel Clock   : cabinet override      10/25/20 0636 10/25/20 0655   10/25/20 0600  ceFAZolin (ANCEF) 2 g in sodium chloride 0.9 % 100 mL IVPB        2 g 200 mL/hr over 30 Minutes Intravenous On call to O.R. 10/25/20 0518 10/25/20 0750      and started on DVT prophylaxis in the form of Aspirin.   PT and OT were ordered for total joint protocol. Discharge planning consulted to help with postop disposition and equipment needs.  Patient had a good night  on the evening of surgery. They started to get up OOB with therapy on POD #0. Pt was seen during rounds and was ready to go home pending progress with therapy.He worked with therapy on POD #1 and was meeting his goals. Pt was discharged to home later that day in stable condition.  Diet: Regular diet Activity: WBAT Follow-up: in 2 weeks Disposition: Home Discharged Condition:  good   Discharge Instructions     Call MD / Call 911   Complete by: As directed    If you experience chest pain or shortness of breath, CALL 911 and be transported to the hospital emergency room.  If you develope a fever above 101 F, pus (white drainage) or increased drainage or redness at the wound, or calf pain, call your surgeon's office.   Change dressing   Complete by: As directed    Maintain surgical dressing until follow up in the clinic. If the edges start to pull up, may reinforce with tape. If the dressing is no longer working, may remove and cover with gauze and tape, but must keep the area dry and clean.  Call with any questions or concerns.   Constipation Prevention   Complete by: As directed    Drink plenty of fluids.  Prune juice may be helpful.  You may use a stool softener, such as Colace (over the counter) 100 mg twice a day.  Use MiraLax (over the counter) for constipation as needed.   Diet - low sodium heart healthy   Complete by: As directed    Increase activity slowly as tolerated   Complete by: As directed    Weight bearing as tolerated with assist device (walker, cane, etc) as directed, use it as long as suggested by your surgeon or therapist, typically at least 4-6 weeks.   Post-operative opioid taper instructions:   Complete by: As directed    POST-OPERATIVE OPIOID TAPER INSTRUCTIONS: It is important to wean off of your opioid medication as soon as possible. If you do not need pain medication after your surgery it is ok to stop day one. Opioids include: Codeine, Hydrocodone(Norco, Vicodin), Oxycodone(Percocet, oxycontin) and hydromorphone amongst others.  Long term and even short term use of opiods can cause: Increased pain response Dependence Constipation Depression Respiratory depression And more.  Withdrawal symptoms can include Flu like symptoms Nausea, vomiting And more Techniques to manage these symptoms Hydrate well Eat regular healthy  meals Stay active Use relaxation techniques(deep breathing, meditating, yoga) Do Not substitute Alcohol to help with tapering If you have been on opioids for less than two weeks and do not have pain than it is ok to stop all together.  Plan to wean off of opioids This plan should start within one week post op of your joint replacement. Maintain the same interval or time between taking each dose and first decrease the dose.  Cut the total daily intake of opioids by one tablet each day Next start to increase the time between doses. The last dose that should be eliminated is the evening dose.      TED hose   Complete by: As directed    Use stockings (TED hose) for 2 weeks on both leg(s).  You may remove them at night for sleeping.      Allergies as of 10/26/2020       Reactions   Ibuprofen    STOMACH ACHE EVEN IF TAKEN WITH FOOD        Medication List  STOP taking these medications    aspirin EC 325 MG tablet Replaced by: aspirin 81 MG chewable tablet   oxyCODONE-acetaminophen 5-325 MG tablet Commonly known as: PERCOCET/ROXICET   tiZANidine 2 MG tablet Commonly known as: ZANAFLEX       TAKE these medications    ALLERGY EYE DROPS OP Place 1 drop into both eyes daily as needed (allergies/redness). Notes to patient: Resume home regimen   amLODipine 10 MG tablet Commonly known as: NORVASC Take 10 mg by mouth daily.   aspirin 81 MG chewable tablet Chew 1 tablet (81 mg total) by mouth 2 (two) times daily for 28 days. Replaces: aspirin EC 325 MG tablet   atorvastatin 20 MG tablet Commonly known as: LIPITOR Take 20 mg by mouth daily.   Buprenorphine HCl-Naloxone HCl 8-2 MG Film Place 1 Film under the tongue 3 (three) times daily.   calcium carbonate 500 MG chewable tablet Commonly known as: TUMS - dosed in mg elemental calcium Chew 1 tablet by mouth daily as needed for indigestion or heartburn. Notes to patient: Last dose given 07/29 09:03am   diclofenac  Sodium 1 % Gel Commonly known as: VOLTAREN Apply 2 g topically 4 (four) times daily as needed for pain. Notes to patient: Resume home regimen   docusate sodium 100 MG capsule Commonly known as: Colace Take 1 capsule (100 mg total) by mouth 2 (two) times daily.   methocarbamol 500 MG tablet Commonly known as: ROBAXIN Take 1 tablet (500 mg total) by mouth every 6 (six) hours as needed for muscle spasms.   multivitamin with minerals tablet Take 1 tablet by mouth daily. Notes to patient: Resume home regimen   oxyCODONE 5 MG immediate release tablet Commonly known as: Oxy IR/ROXICODONE Take 1-2 tablets (5-10 mg total) by mouth every 4 (four) hours as needed for severe pain.   triamcinolone cream 0.1 % Commonly known as: KENALOG Apply 1 application topically 2 (two) times daily as needed for itching. Notes to patient: Resume home regimen               Discharge Care Instructions  (From admission, onward)           Start     Ordered   10/26/20 0000  Change dressing       Comments: Maintain surgical dressing until follow up in the clinic. If the edges start to pull up, may reinforce with tape. If the dressing is no longer working, may remove and cover with gauze and tape, but must keep the area dry and clean.  Call with any questions or concerns.   10/26/20 4098            Follow-up Information     Durene Romans, MD. Schedule an appointment as soon as possible for a visit in 2 week(s).   Specialty: Orthopedic Surgery Contact information: [address] [place] [address] [phone]                 Signed: Dennie Bible, PA-C Orthopedic Surgery 11/01/2020, 3:56 PM

## 2021-11-02 IMAGING — DX DG CHEST 2V
2 series · 2 of 2 positions shown · non-contrast
Comparison: 11/19/2016

CLINICAL DATA: Preoperative respiratory evaluation.

EXAM:
CHEST - 2 VIEW

[chest pa]
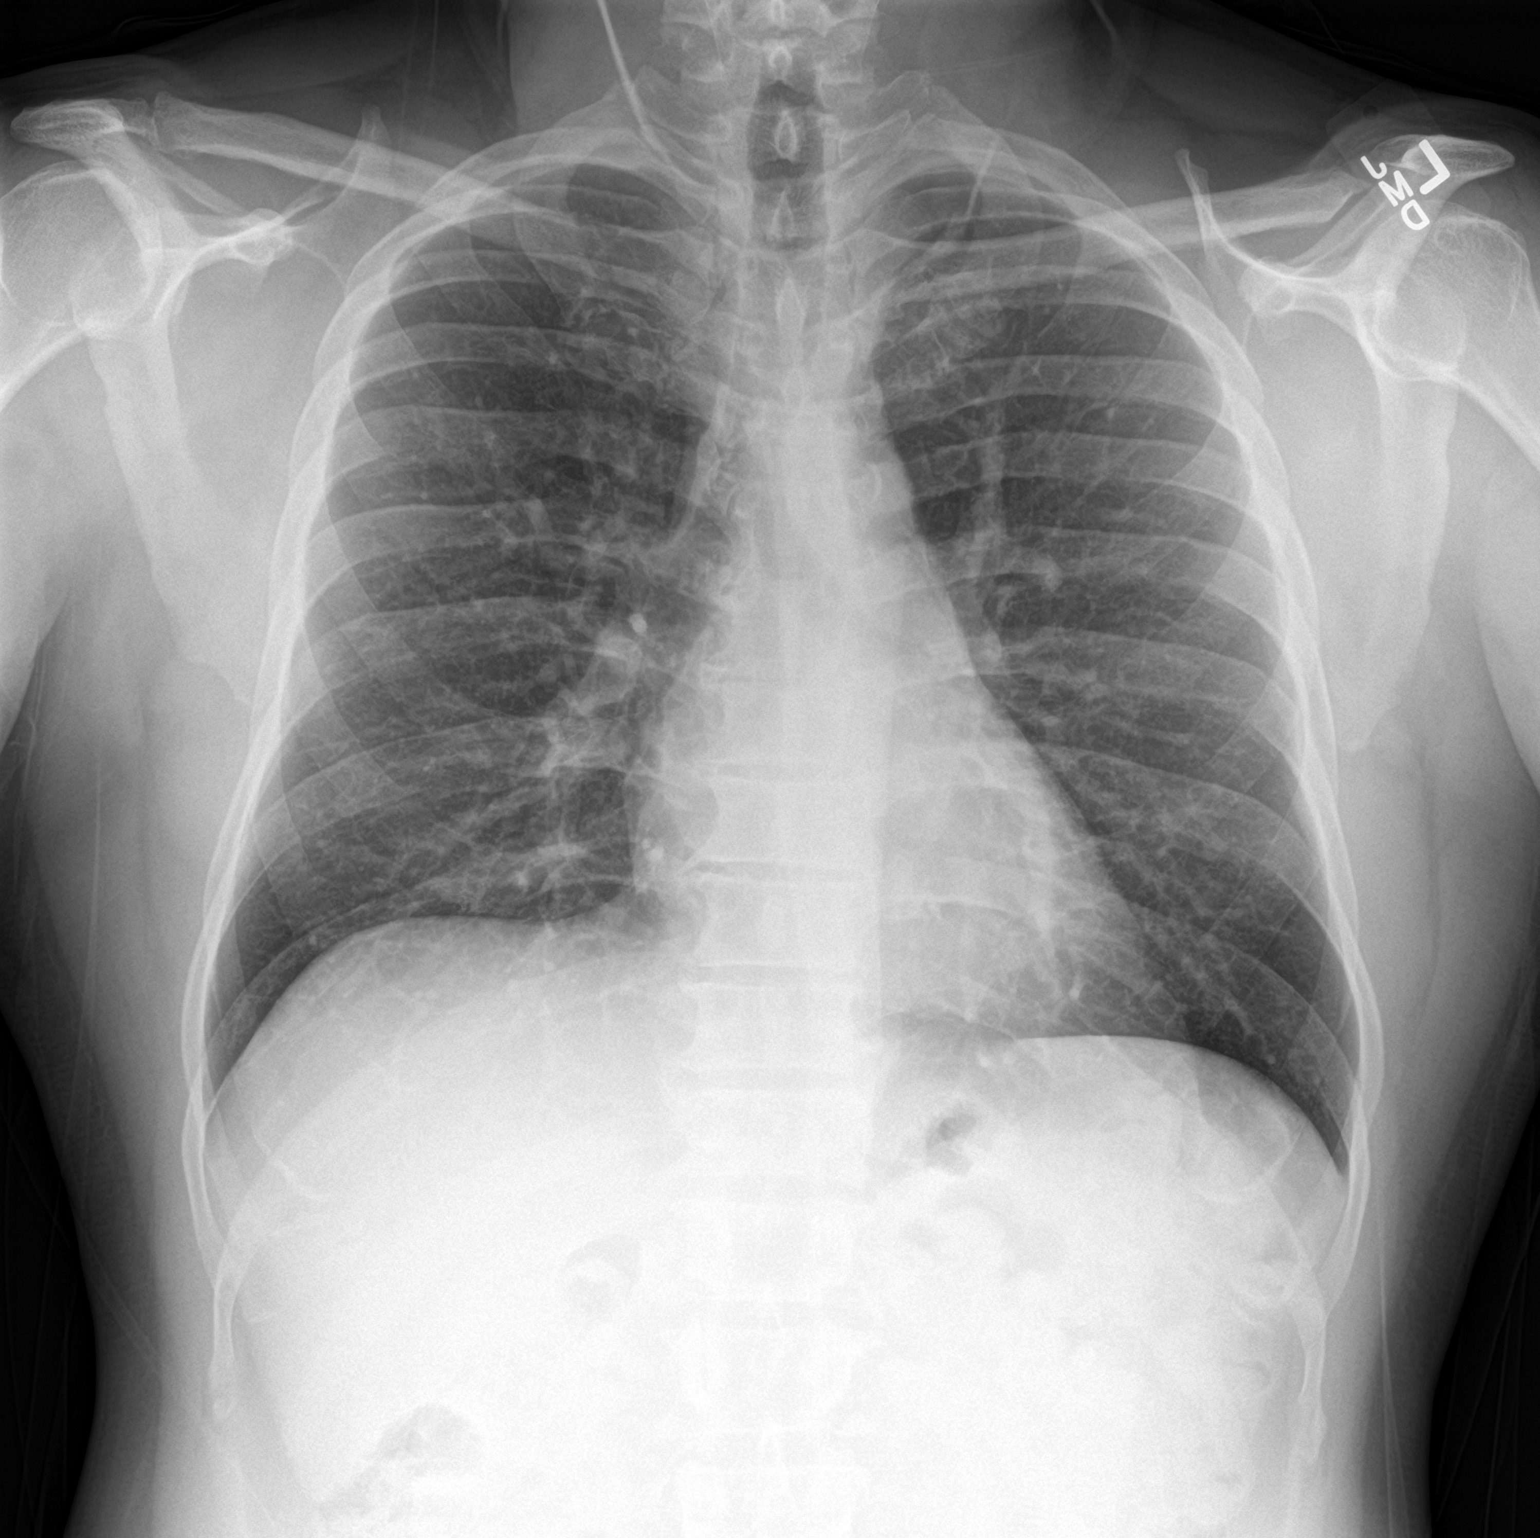

[chest lat]
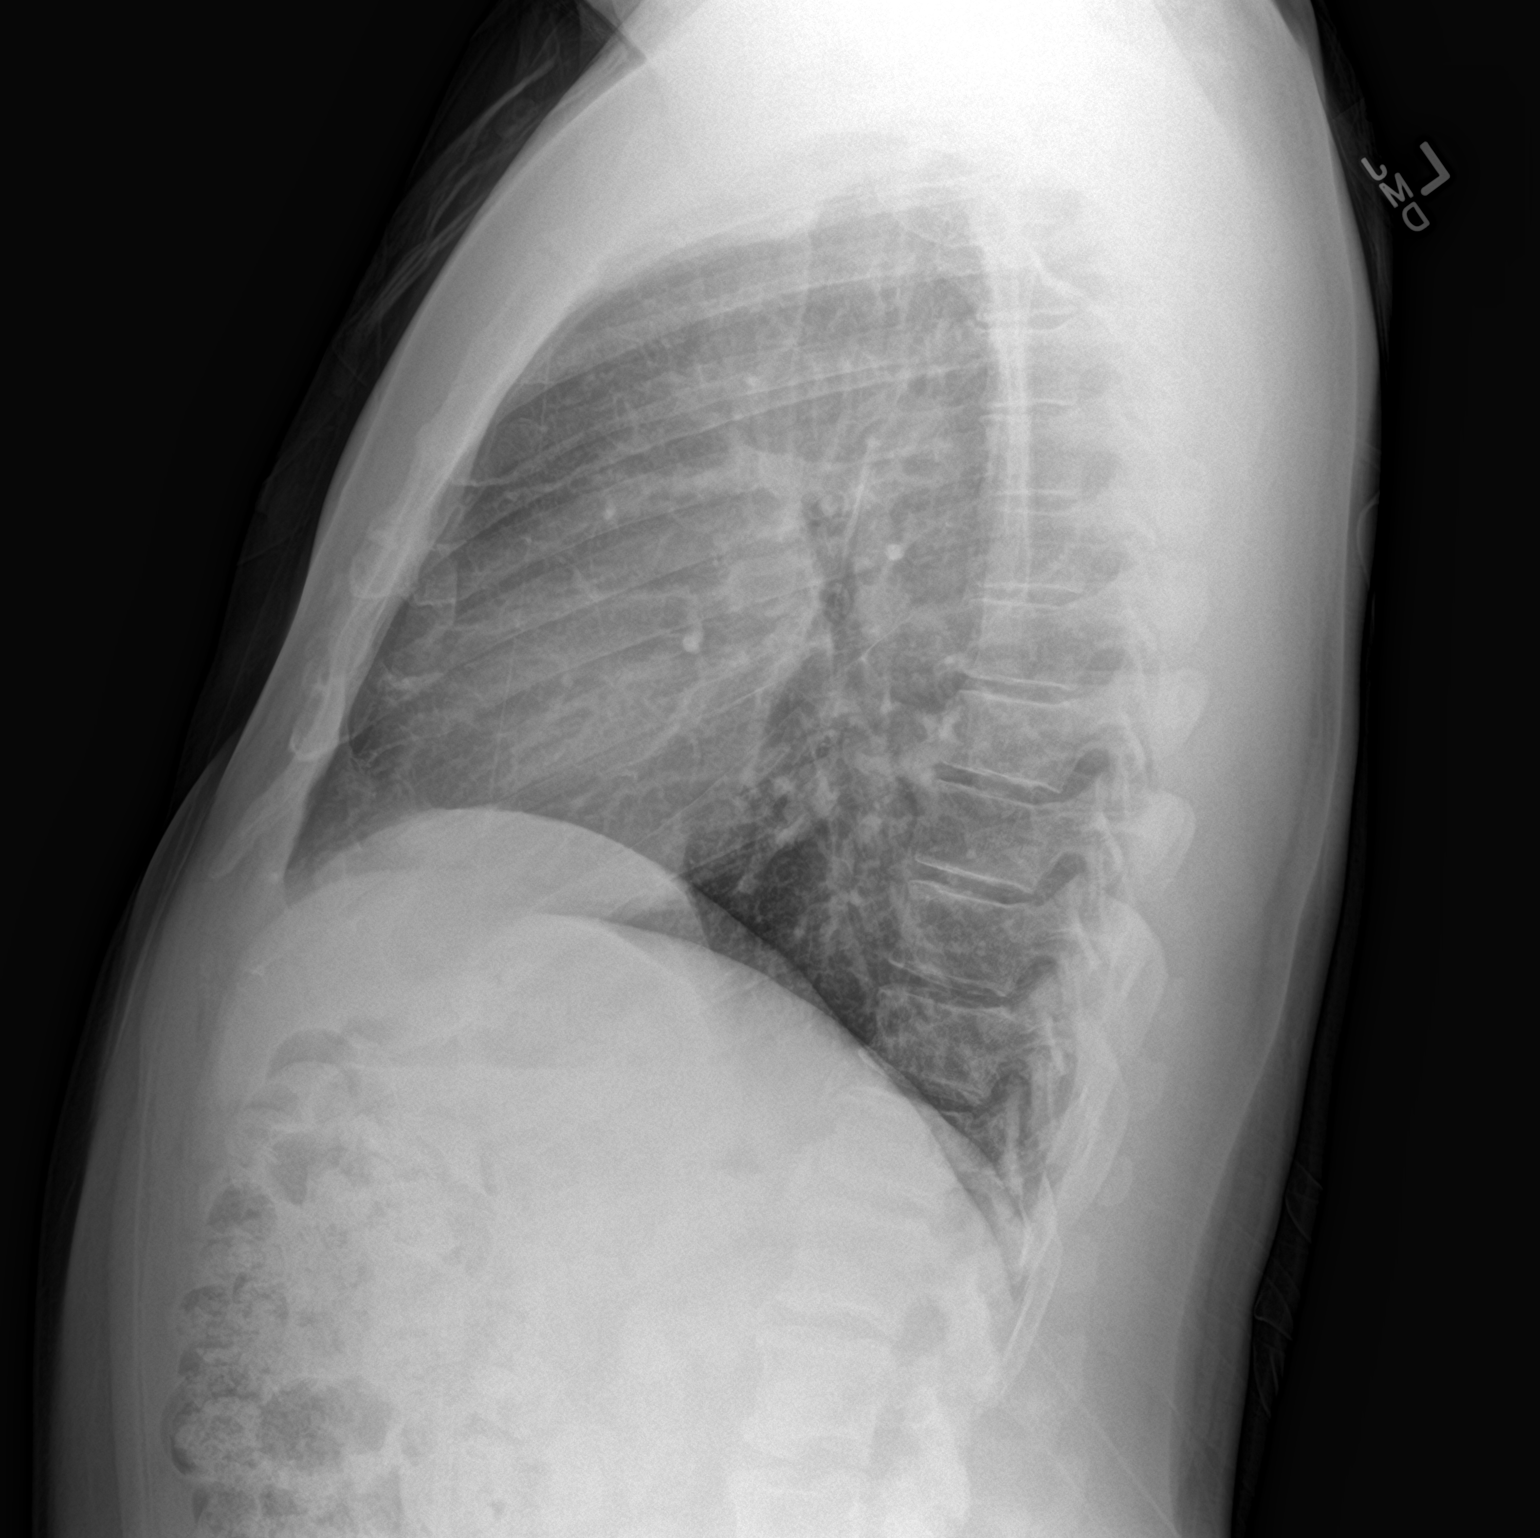

[2 of 2 positions shown; findings below may reference images not displayed]

FINDINGS: The lungs are clear without focal pneumonia, edema, pneumothorax or
pleural effusion. The cardiopericardial silhouette is within normal
limits for size. The visualized bony structures of the thorax are
intact.
IMPRESSION: No active cardiopulmonary disease.
